# Patient Record
Sex: Male | Born: 1960
Health system: Southern US, Community
[De-identification: ages and names within clinical notes are randomized; demographics above are authoritative.]

## PROBLEM LIST (undated history)

## (undated) DIAGNOSIS — C61 Malignant neoplasm of prostate: Secondary | ICD-10-CM

## (undated) DIAGNOSIS — K219 Gastro-esophageal reflux disease without esophagitis: Secondary | ICD-10-CM

## (undated) DIAGNOSIS — I10 Essential (primary) hypertension: Secondary | ICD-10-CM

## (undated) HISTORY — PX: PROSTATE BIOPSY: SHX241

## (undated) HISTORY — PX: SHOULDER SURGERY: SHX246

## (undated) HISTORY — PX: COLONOSCOPY: SHX174

---

## 2008-09-29 DIAGNOSIS — S76119A Strain of unspecified quadriceps muscle, fascia and tendon, initial encounter: Secondary | ICD-10-CM | POA: Insufficient documentation

## 2008-09-29 DIAGNOSIS — M79659 Pain in unspecified thigh: Secondary | ICD-10-CM | POA: Insufficient documentation

## 2008-10-26 DIAGNOSIS — S76119A Strain of unspecified quadriceps muscle, fascia and tendon, initial encounter: Secondary | ICD-10-CM | POA: Insufficient documentation

## 2014-06-10 DEATH — deceased

## 2015-04-10 DIAGNOSIS — M436 Torticollis: Secondary | ICD-10-CM | POA: Diagnosis not present

## 2015-04-10 DIAGNOSIS — H6691 Otitis media, unspecified, right ear: Secondary | ICD-10-CM | POA: Diagnosis not present

## 2015-05-10 DIAGNOSIS — R3915 Urgency of urination: Secondary | ICD-10-CM | POA: Diagnosis not present

## 2015-05-10 DIAGNOSIS — R39198 Other difficulties with micturition: Secondary | ICD-10-CM | POA: Diagnosis not present

## 2015-05-10 DIAGNOSIS — R972 Elevated prostate specific antigen [PSA]: Secondary | ICD-10-CM | POA: Diagnosis not present

## 2015-08-11 DIAGNOSIS — L300 Nummular dermatitis: Secondary | ICD-10-CM | POA: Diagnosis not present

## 2015-08-11 DIAGNOSIS — L86 Keratoderma in diseases classified elsewhere: Secondary | ICD-10-CM | POA: Diagnosis not present

## 2015-08-11 DIAGNOSIS — L730 Acne keloid: Secondary | ICD-10-CM | POA: Diagnosis not present

## 2015-08-11 DIAGNOSIS — L905 Scar conditions and fibrosis of skin: Secondary | ICD-10-CM | POA: Diagnosis not present

## 2015-09-09 DIAGNOSIS — Z6833 Body mass index (BMI) 33.0-33.9, adult: Secondary | ICD-10-CM | POA: Diagnosis not present

## 2015-09-09 DIAGNOSIS — Z131 Encounter for screening for diabetes mellitus: Secondary | ICD-10-CM | POA: Diagnosis not present

## 2015-09-09 DIAGNOSIS — K210 Gastro-esophageal reflux disease with esophagitis: Secondary | ICD-10-CM | POA: Diagnosis not present

## 2015-09-09 DIAGNOSIS — L2089 Other atopic dermatitis: Secondary | ICD-10-CM | POA: Diagnosis not present

## 2015-09-09 DIAGNOSIS — Z79899 Other long term (current) drug therapy: Secondary | ICD-10-CM | POA: Diagnosis not present

## 2015-12-29 DIAGNOSIS — L730 Acne keloid: Secondary | ICD-10-CM | POA: Diagnosis not present

## 2015-12-29 DIAGNOSIS — D692 Other nonthrombocytopenic purpura: Secondary | ICD-10-CM | POA: Diagnosis not present

## 2015-12-29 DIAGNOSIS — L810 Postinflammatory hyperpigmentation: Secondary | ICD-10-CM | POA: Diagnosis not present

## 2015-12-29 DIAGNOSIS — L309 Dermatitis, unspecified: Secondary | ICD-10-CM | POA: Diagnosis not present

## 2016-05-08 DIAGNOSIS — K210 Gastro-esophageal reflux disease with esophagitis: Secondary | ICD-10-CM | POA: Diagnosis not present

## 2016-05-08 DIAGNOSIS — Z6833 Body mass index (BMI) 33.0-33.9, adult: Secondary | ICD-10-CM | POA: Diagnosis not present

## 2016-05-08 DIAGNOSIS — B029 Zoster without complications: Secondary | ICD-10-CM | POA: Diagnosis not present

## 2016-05-08 DIAGNOSIS — J3089 Other allergic rhinitis: Secondary | ICD-10-CM | POA: Diagnosis not present

## 2017-02-08 DIAGNOSIS — I10 Essential (primary) hypertension: Secondary | ICD-10-CM | POA: Diagnosis not present

## 2017-02-08 DIAGNOSIS — Z125 Encounter for screening for malignant neoplasm of prostate: Secondary | ICD-10-CM | POA: Diagnosis not present

## 2017-02-08 DIAGNOSIS — Z6833 Body mass index (BMI) 33.0-33.9, adult: Secondary | ICD-10-CM | POA: Diagnosis not present

## 2017-02-08 DIAGNOSIS — K210 Gastro-esophageal reflux disease with esophagitis: Secondary | ICD-10-CM | POA: Diagnosis not present

## 2017-02-08 DIAGNOSIS — Z6832 Body mass index (BMI) 32.0-32.9, adult: Secondary | ICD-10-CM | POA: Diagnosis not present

## 2017-02-08 DIAGNOSIS — J3089 Other allergic rhinitis: Secondary | ICD-10-CM | POA: Diagnosis not present

## 2017-02-08 DIAGNOSIS — N410 Acute prostatitis: Secondary | ICD-10-CM | POA: Diagnosis not present

## 2017-04-10 ENCOUNTER — Ambulatory Visit: Payer: BLUE CROSS/BLUE SHIELD | Admitting: Urology

## 2017-04-10 DIAGNOSIS — N401 Enlarged prostate with lower urinary tract symptoms: Secondary | ICD-10-CM | POA: Diagnosis not present

## 2017-04-10 DIAGNOSIS — R972 Elevated prostate specific antigen [PSA]: Secondary | ICD-10-CM

## 2017-04-10 DIAGNOSIS — R3915 Urgency of urination: Secondary | ICD-10-CM

## 2017-05-25 DIAGNOSIS — J069 Acute upper respiratory infection, unspecified: Secondary | ICD-10-CM | POA: Diagnosis not present

## 2017-05-25 DIAGNOSIS — H6123 Impacted cerumen, bilateral: Secondary | ICD-10-CM | POA: Diagnosis not present

## 2017-05-25 DIAGNOSIS — R0982 Postnasal drip: Secondary | ICD-10-CM | POA: Diagnosis not present

## 2017-05-31 DIAGNOSIS — M25512 Pain in left shoulder: Secondary | ICD-10-CM | POA: Diagnosis not present

## 2017-11-28 DIAGNOSIS — Z6834 Body mass index (BMI) 34.0-34.9, adult: Secondary | ICD-10-CM | POA: Diagnosis not present

## 2017-11-28 DIAGNOSIS — Z0000 Encounter for general adult medical examination without abnormal findings: Secondary | ICD-10-CM | POA: Diagnosis not present

## 2018-07-18 DIAGNOSIS — Z03818 Encounter for observation for suspected exposure to other biological agents ruled out: Secondary | ICD-10-CM | POA: Diagnosis not present

## 2018-07-18 DIAGNOSIS — R51 Headache: Secondary | ICD-10-CM | POA: Diagnosis not present

## 2018-09-04 DIAGNOSIS — R55 Syncope and collapse: Secondary | ICD-10-CM | POA: Diagnosis not present

## 2018-09-04 DIAGNOSIS — I10 Essential (primary) hypertension: Secondary | ICD-10-CM | POA: Diagnosis not present

## 2018-09-04 DIAGNOSIS — Z6833 Body mass index (BMI) 33.0-33.9, adult: Secondary | ICD-10-CM | POA: Diagnosis not present

## 2018-09-04 DIAGNOSIS — N400 Enlarged prostate without lower urinary tract symptoms: Secondary | ICD-10-CM | POA: Diagnosis not present

## 2018-09-04 DIAGNOSIS — Z125 Encounter for screening for malignant neoplasm of prostate: Secondary | ICD-10-CM | POA: Diagnosis not present

## 2018-09-04 DIAGNOSIS — K210 Gastro-esophageal reflux disease with esophagitis: Secondary | ICD-10-CM | POA: Diagnosis not present

## 2018-09-10 DIAGNOSIS — R55 Syncope and collapse: Secondary | ICD-10-CM | POA: Diagnosis not present

## 2018-09-10 HISTORY — DX: Syncope and collapse: R55

## 2018-09-23 DIAGNOSIS — M25552 Pain in left hip: Secondary | ICD-10-CM | POA: Diagnosis not present

## 2018-09-23 DIAGNOSIS — Z79899 Other long term (current) drug therapy: Secondary | ICD-10-CM | POA: Diagnosis not present

## 2018-09-23 DIAGNOSIS — R102 Pelvic and perineal pain: Secondary | ICD-10-CM | POA: Diagnosis not present

## 2018-09-23 DIAGNOSIS — R1032 Left lower quadrant pain: Secondary | ICD-10-CM | POA: Diagnosis not present

## 2018-09-23 DIAGNOSIS — M545 Low back pain: Secondary | ICD-10-CM | POA: Diagnosis not present

## 2018-09-23 DIAGNOSIS — I10 Essential (primary) hypertension: Secondary | ICD-10-CM | POA: Diagnosis not present

## 2018-10-08 ENCOUNTER — Ambulatory Visit: Payer: BLUE CROSS/BLUE SHIELD | Admitting: Urology

## 2018-11-05 ENCOUNTER — Ambulatory Visit: Payer: BC Managed Care – PPO | Admitting: Urology

## 2018-12-16 DIAGNOSIS — Z6832 Body mass index (BMI) 32.0-32.9, adult: Secondary | ICD-10-CM | POA: Diagnosis not present

## 2018-12-16 DIAGNOSIS — Z125 Encounter for screening for malignant neoplasm of prostate: Secondary | ICD-10-CM | POA: Diagnosis not present

## 2018-12-16 DIAGNOSIS — Z0000 Encounter for general adult medical examination without abnormal findings: Secondary | ICD-10-CM | POA: Diagnosis not present

## 2019-01-21 ENCOUNTER — Other Ambulatory Visit: Payer: Self-pay

## 2019-01-21 ENCOUNTER — Other Ambulatory Visit: Payer: Self-pay | Admitting: Urology

## 2019-01-21 ENCOUNTER — Ambulatory Visit (INDEPENDENT_AMBULATORY_CARE_PROVIDER_SITE_OTHER): Payer: BC Managed Care – PPO | Admitting: Urology

## 2019-01-21 ENCOUNTER — Encounter: Payer: Self-pay | Admitting: Urology

## 2019-01-21 VITALS — BP 135/90 | HR 85 | Temp 96.8°F | Ht 66.0 in | Wt 200.0 lb

## 2019-01-21 DIAGNOSIS — R972 Elevated prostate specific antigen [PSA]: Secondary | ICD-10-CM | POA: Diagnosis not present

## 2019-01-21 LAB — POCT URINALYSIS DIPSTICK
Bilirubin, UA: NEGATIVE
Blood, UA: NEGATIVE
Glucose, UA: NEGATIVE
Ketones, UA: NEGATIVE
Leukocytes, UA: NEGATIVE
Nitrite, UA: NEGATIVE
Protein, UA: NEGATIVE
Spec Grav, UA: >=1.03 — AB (ref 1.010–1.025)
Urobilinogen, UA: NEGATIVE E.U./dL — AB
pH, UA: 5 (ref 5.0–8.0)

## 2019-01-21 MED ORDER — LEVOFLOXACIN 750 MG PO TABS: 750.0000 mg | ORAL_TABLET | Freq: Every day | ORAL | 0 refills | Status: AC

## 2019-01-21 NOTE — Progress Notes (Signed)
H&P  Chief Complaint: Elevated PSA  History of Present Illness:   1.12.2021: Here today for follow-up on elevated PSA. Most recent PSA was 8.6 on 12.7.2020 -- this is quite elevated from him and he was told to return here per his PCP. He denies having had any significant urinary complaints or changes in the last year. He does note 2 or so episodes of urge incontinence -- mostly provoked by key in door -- but this is not all that bothersome or concerning for him at the moment. He denies having had issues with weak stream.   IPSS Questionnaire (AUA-7): Over the past month.   1)  How often have you had a sensation of not emptying your bladder completely after you finish urinating?  5 - Almost always  2)  How often have you had to urinate again less than two hours after you finished urinating? 3 - About half the time  3)  How often have you found you stopped and started again several times when you urinated?  1 - Less than 1 time in 5  4) How difficult have you found it to postpone urination?  4 - More than half the time  5) How often have you had a weak urinary stream?  0 - Not at all  6) How often have you had to push or strain to begin urination?  0 - Not at all  7) How many times did you most typically get up to urinate from the time you went to bed until the time you got up in the morning?  1 - 1 time  Total score:  0-7 mildly symptomatic   8-19 moderately symptomatic   20-35 severely symptomatic     (below copied from [facility] records):   Russell Cohen is a 59 year-old male patient who was referred by Stoney Bang who is here evaluation of his PSA.  He is a patient of Dr Sherrie Sport seen in office consultation today. His last PSA was performed 02/08/2017. The last PSA value was 6.2. The patient states he does not take 5 alpha reductase inhibitor medication. He has not undergone a prior prostate biopsy. Patient does not have a family history of prostate cancer. The patient complains of lower urinary  tract symptom(s) that include urgency and nocturia. He does not have a history of prostatitis.   Referred by [name] for evaluation and management of elevated PSA. No family history of prostate cancer. His father may have had lower urinary tract symptomatology but did not have surgery. His PSA was checked just over 2 months ago when was 6.2. I have no previous data for his PSAs.   He does have lower urinary tract symptoms. Biggest issue is urinary urgency, occasional UI. IPSS 8.   He also has vague sense of discomfort in his right perirectal area. He says it feels like a pressure and tingles at times.    Home Medications:  Allergies as of 01/21/2019   No Known Allergies     Medication List       Accurate as of January 21, 2019 12:13 PM. If you have any questions, ask your nurse or doctor.        alfuzosin 10 MG 24 hr tablet Commonly known as: UROXATRAL Take 10 mg by mouth daily with breakfast.   metoprolol succinate 25 MG 24 hr tablet Commonly known as: TOPROL-XL Take 25 mg by mouth daily.       Allergies: No Known Allergies  No family  history on file.  Social History:  has no history on file for tobacco, alcohol, and drug.  ROS: A complete review of systems was performed.  All systems are negative except for pertinent findings as noted.  Physical Exam:  Vital signs in last 24 hours: BP 135/90   Pulse 85   Temp (!) 96.8 F (36 C)   Ht 5\' 6"  (1.676 m)   Wt 200 lb (90.7 kg)   BMI 32.28 kg/m  Constitutional:  Alert and oriented, No acute distress Cardiovascular: Regular rate  Respiratory: Normal respiratory effort GI: Abdomen is soft, nontender, nondistended, no abdominal masses. No CVAT. No hernias Genitourinary: Normal male phallus (uncircumcised), testes are descended bilaterally and non-tender and without masses, scrotum is normal in appearance without lesions or masses, perineum is normal on inspection. Prostate feels 40 grams in size.    Lymphatic: No  lymphadenopathy Neurologic: Grossly intact, no focal deficits Psychiatric: Normal mood and affect  Laboratory Data:  No results for input(s): WBC, HGB, HCT, PLT in the last 72 hours.  No results for input(s): NA, K, CL, GLUCOSE, BUN, CALCIUM, CREATININE in the last 72 hours.  Invalid input(s): CO3   Results for orders placed or performed in visit on 01/21/19 (from the past 24 hour(s))  POCT urinalysis dipstick     Status: Abnormal   Collection Time: 01/21/19 11:59 AM  Result Value Ref Range   Color, UA yellow    Clarity, UA clear    Glucose, UA Negative Negative   Bilirubin, UA neg    Ketones, UA neg    Spec Grav, UA >=1.030 (A) 1.010 - 1.025   Blood, UA neg    pH, UA 5.0 5.0 - 8.0   Protein, UA Negative Negative   Urobilinogen, UA negative (A) 0.2 or 1.0 E.U./dL   Nitrite, UA neg    Leukocytes, UA Negative Negative   Appearance clear    Odor     No results found for this or any previous visit (from the past 240 hour(s)).   Old records from Dr Sherrie Sport, PSA, U/A reviewed as well as old [facility]  notes  Renal Function: No results for input(s): CREATININE in the last 168 hours. CrCl cannot be calculated (No successful lab value found.).  Radiologic Imaging: No results found.  Impression/Assessment:  His PSA is significantly elevated from what seemed to be his baseline (5-6). Though it is possible that this increase could be due to a benign source, I believe it would be appropriate to move forward with [place] bx to screen for possible PCa.   Plan:   1. [place] bx -- will call to schedule. Instructions given per nurse and we discussed the procedure, what to expect, and possible risks and complications.   2. Pending results, will plan appropriate follow-up.   3. No need for repeat PSA today.

## 2019-01-21 NOTE — Patient Instructions (Addendum)
  Prostate Biopsy Instructions  Stop all aspirin or blood thinners (aspirin, plavix, coumadin, warfarin, motrin, ibuprofen, advil, aleve, naproxen, naprosyn) for 7 days prior to the procedure.  If you have any questions about stopping these medications, please contact your primary care physician or cardiologist.  Having a light meal prior to the procedure is recommended.  If you are diabetic or have low blood sugar please bring a small snack or glucose tablet.  A Fleets enema is needed to be purchased over the counter at a local pharmacy and used 2 hours before you scheduled appointment.  This can be purchased over the counter at any pharmacy.  Antibiotics will be administered in the clinic at the time of the procedure unless otherwise specified.    Please bring someone with you to the procedure to drive you home.  A follow up appointment has been scheduled for you to receive the results of the biopsy.  If you have any questions or concerns, please feel free to call the office at [phone] or send a Mychart message.    Thank you, [facility]

## 2019-02-25 ENCOUNTER — Other Ambulatory Visit: Payer: Self-pay | Admitting: Urology

## 2019-02-25 ENCOUNTER — Encounter (HOSPITAL_COMMUNITY): Payer: Self-pay

## 2019-02-25 ENCOUNTER — Ambulatory Visit (HOSPITAL_COMMUNITY)
Admission: RE | Admit: 2019-02-25 | Discharge: 2019-02-25 | Disposition: A | Discharge status: Home / Self Care | Payer: BC Managed Care – PPO | Source: Ambulatory Visit | Attending: Urology | Admitting: Urology

## 2019-02-25 ENCOUNTER — Other Ambulatory Visit: Payer: Self-pay

## 2019-02-25 DIAGNOSIS — C61 Malignant neoplasm of prostate: Secondary | ICD-10-CM | POA: Diagnosis not present

## 2019-02-25 DIAGNOSIS — R972 Elevated prostate specific antigen [PSA]: Secondary | ICD-10-CM | POA: Diagnosis not present

## 2019-02-25 DIAGNOSIS — D075 Carcinoma in situ of prostate: Secondary | ICD-10-CM | POA: Diagnosis not present

## 2019-02-25 NOTE — Procedures (Signed)
This man presents for TRUS/Bx.  Reason for procedure: Elevated PSA  Prostate exam: Symmetrical, nonnodular 40 mL gland  Appropriate timeout was performed. The patient was placed in the left lateral decubitus position.  Transrectal ultrasound probe was passed without difficulty. Prostate was scanned in transverse and sagittal dimensions.  Volume was obtained--31.89 ml. Ultrasound findings: Seminal vesicles appeared normal.  Hypoechoic areas noted within the peripheral zone, especially on the right side.  Minimal residual urine volume within the bladder. Using a spinal needle, 10 mL of 2% plain lidocaine was utilized to infiltrate the lateral aspect of the seminal vesicles bilaterally. Following this, 12 cores were taken using the biopsy gun, and the following distribution: Right base lateral, right base medial, right mid lateral, right mid medial, right apex lateral, right apex medial, left base lateral, left base medial, left mid lateral, left mid medial, left apex lateral, left apex medial.  These were all sent for pathologic review separately in formalin.  At this point, the probe was removed.  Pressure was placed on the anal area for approximately 1 minute.  After establishing no significant bleeding, the procedure was then terminated.  The patient tolerated the procedure well. He was given post-boipsy instructions. We will call with pathology results and followup.

## 2019-03-13 ENCOUNTER — Telehealth: Payer: Self-pay

## 2019-03-13 NOTE — Telephone Encounter (Signed)
Called pt. No answer. Lm we scheduled f/u appt. Appt mailed along with copy of results mailed. Path report fax via epic to PCP

## 2019-03-13 NOTE — Telephone Encounter (Signed)
-----   Message from [name], MD sent at 03/13/2019 12:38 PM EST ----- Pt called w/ Bx results. Needs PCa apt, send bx results to pt and Dr Sherrie Sport ----- Message ----- From: Dorisann Frames, RN Sent: 02/26/2019   3:48 PM EST To: [name], MD  Prostate biopsy result

## 2019-03-24 DIAGNOSIS — I10 Essential (primary) hypertension: Secondary | ICD-10-CM | POA: Diagnosis not present

## 2019-03-24 DIAGNOSIS — K2100 Gastro-esophageal reflux disease with esophagitis, without bleeding: Secondary | ICD-10-CM | POA: Diagnosis not present

## 2019-03-24 DIAGNOSIS — N400 Enlarged prostate without lower urinary tract symptoms: Secondary | ICD-10-CM | POA: Diagnosis not present

## 2019-03-24 DIAGNOSIS — Z6831 Body mass index (BMI) 31.0-31.9, adult: Secondary | ICD-10-CM | POA: Diagnosis not present

## 2019-04-22 ENCOUNTER — Encounter: Payer: Self-pay | Admitting: Urology

## 2019-04-22 ENCOUNTER — Ambulatory Visit (INDEPENDENT_AMBULATORY_CARE_PROVIDER_SITE_OTHER): Payer: BC Managed Care – PPO | Admitting: Urology

## 2019-04-22 ENCOUNTER — Other Ambulatory Visit: Payer: Self-pay

## 2019-04-22 VITALS — BP 119/74 | HR 89 | Temp 97.2°F | Ht 66.0 in | Wt 200.0 lb

## 2019-04-22 DIAGNOSIS — N401 Enlarged prostate with lower urinary tract symptoms: Secondary | ICD-10-CM | POA: Insufficient documentation

## 2019-04-22 DIAGNOSIS — R972 Elevated prostate specific antigen [PSA]: Secondary | ICD-10-CM | POA: Diagnosis not present

## 2019-04-22 DIAGNOSIS — R3915 Urgency of urination: Secondary | ICD-10-CM | POA: Diagnosis not present

## 2019-04-22 DIAGNOSIS — C61 Malignant neoplasm of prostate: Secondary | ICD-10-CM | POA: Insufficient documentation

## 2019-04-22 LAB — POCT URINALYSIS DIPSTICK
Bilirubin, UA: NEGATIVE
Blood, UA: NEGATIVE
Glucose, UA: NEGATIVE
Ketones, UA: NEGATIVE
Leukocytes, UA: NEGATIVE
Nitrite, UA: NEGATIVE
Protein, UA: NEGATIVE
Spec Grav, UA: <=1.005 — AB (ref 1.010–1.025)
Urobilinogen, UA: 0.2 E.U./dL
pH, UA: 6 (ref 5.0–8.0)

## 2019-04-22 NOTE — Progress Notes (Signed)
H&P  Chief Complaint: New diagnosis of prostate cancer  History of Present Illness: 59 year old male with recently diagnosed prostate cancer presents for prostate cancer consultation.  At the time of diagnosis, PSA was 8.6.  He underwent ultrasound and biopsy of his prostate on 02/25/2019.  At that time, prostate volume was 32 mL.  4/12 cores were positive for adenocarcinoma: 2 cores revealed GS 3+4 pattern--right mid lateral (90%), left apex medial (10%) 2 cores revealed GS 3+3 pattern--right base lateral (20%), left mid lateral (5%)  IPSS 8, quality-of-life score 3.  He is on alfuzosin Shim score 18  No past medical history on file.  No past surgical history on file.  Home Medications:  Allergies as of 04/22/2019   No Known Allergies     Medication List       Accurate as of April 22, 2019  4:19 PM. If you have any questions, ask your nurse or doctor.        alfuzosin 10 MG 24 hr tablet Commonly known as: UROXATRAL Take 10 mg by mouth daily with breakfast.   cyclobenzaprine 10 MG tablet Commonly known as: FLEXERIL Take 10 mg by mouth every 8 (eight) hours as needed.   levocetirizine 5 MG tablet Commonly known as: XYZAL Take 5 mg by mouth at bedtime.   metoprolol succinate 25 MG 24 hr tablet Commonly known as: TOPROL-XL Take 25 mg by mouth daily.       Allergies: No Known Allergies  No family history on file.  Social History:  has no history on file for tobacco, alcohol, and drug.  ROS: A complete review of systems was performed.  All systems are negative except for pertinent findings as noted.  Physical Exam:  Vital signs in last 24 hours: BP 119/74   Pulse 89   Temp (!) 97.2 F (36.2 C)   Ht 5\' 6"  (1.676 m)   Wt 200 lb (90.7 kg)   BMI 32.28 kg/m  Constitutional:  Alert and oriented, No acute distress Cardiovascular: Regular rate  Respiratory: Normal respiratory effort Lymphatic: No lymphadenopathy Neurologic: Grossly intact, no focal  deficits Psychiatric: Normal mood and affect  I have reviewed prior pt notes  I have reviewed notes from referring/previous physicians  I have reviewed urinalysis results  I have independently reviewed prior imaging  I have reviewed prior PSA/bx  results   Results for orders placed or performed in visit on 04/22/19 (from the past 24 hour(s))  POCT urinalysis dipstick     Status: Abnormal   Collection Time: 04/22/19  4:03 PM  Result Value Ref Range   Color, UA yellow    Clarity, UA     Glucose, UA Negative Negative   Bilirubin, UA neg    Ketones, UA neg    Spec Grav, UA <=1.005 (A) 1.010 - 1.025   Blood, UA neg    pH, UA 6.0 5.0 - 8.0   Protein, UA Negative Negative   Urobilinogen, UA 0.2 0.2 or 1.0 E.U./dL   Nitrite, UA neg    Leukocytes, UA Negative Negative   Appearance clear    Odor     No results found for this or any previous visit (from the past 240 hour(s)).  Renal Function: No results for input(s): CREATININE in the last 168 hours. CrCl cannot be calculated (No successful lab value found.).  Radiologic Imaging: No results found.  Impression/Assessment:  New diagnosis of low/low intermediate risk prostate cancer, patient does have mild to moderate voiding symptoms despite alfuzosin.  Fair erectile function  Plan:  The patient was counseled about the natural history of prostate cancer and the standard treatment options that are available for prostate cancer. It was explained to him how his age and life expectancy, clinical stage, Gleason score, and PSA affect his prognosis, the decision to proceed with additional staging studies, as well as how that information influences recommended treatment strategies. We discussed the roles for active surveillance, radiation therapy, surgical therapy, androgen deprivation, as well as ablative therapy options for the treatment of prostate cancer as appropriate to his individual cancer situation. We discussed the risks and  benefits of these options with regard to their impact on cancer control and also in terms of potential adverse events, complications, and impact on quality of life particularly related to urinary, bowel, and sexual function. The patient was encouraged to ask questions throughout the discussion today and all questions were answered to his stated satisfaction. In addition, the patient was provided with and/or directed to appropriate resources and literature for further education about prostate cancer and treatment options.  He is more interested in brachytherapy then with surgery.  I did discuss with him the fact that symptoms may be exacerbated with the brachytherapy.  He would still like to proceed.  I will set up an appointment for him to see Dr. Tyler Pita at the regional [place].  47 minutes spent face-to-face with the patient and his brother in this consultation.

## 2019-04-22 NOTE — Progress Notes (Signed)

## 2019-05-05 ENCOUNTER — Encounter: Payer: Self-pay | Admitting: Radiation Oncology

## 2019-05-05 NOTE — Progress Notes (Signed)
GU Location of Tumor / Histology: prostatic adenocarcinoma  If Prostate Cancer, Gleason Score is (3 + 4) and PSA is (8.6) on 12/16/2018. Prostate volume: 32 mL.  Russell Cohen's psa in January 2019 was 6.2. Unfortunately his PSA December 2020 was even higher at 8.6. His PCP, Dr. Stoney Bang, referred him to [name] for further evaluation of his elevated PSA.  Biopsies of prostate (if applicable) revealed:    Past/Anticipated interventions by urology, if any: prostate biopsy, referral to Dr. Tammi Klippel to discuss brachytherapy  Past/Anticipated interventions by medical oncology, if any: no  Weight changes, if any: no  Bowel/Bladder complaints, if any: IPSS 8. SHIM 17. Patient reports he isn't sexually active at this time since he is single. Reports occasional urge incontinence. Denies dysuria or hematuria. Denies any bowel complaints.  Nausea/Vomiting, if any: no  Pain issues, if any:  Reports occasional left shoulder pain related to fall at work that resulted in shoulder surgery.  SAFETY ISSUES:  Prior radiation? denies  Pacemaker/ICD? denies  Possible current pregnancy? no, male patient  Is the patient on methotrexate? no  Current Complaints / other details:  59 year old male. Single. One child. Taking alfuzosin. Father had an enlarge prostate but was never dx with prostate ca. Patient's oldest brother just finished up tx for prostate ca. Patient has a paternal uncle with hx of prostate ca and a cousin.

## 2019-05-06 ENCOUNTER — Ambulatory Visit
Admission: RE | Admit: 2019-05-06 | Discharge: 2019-05-06 | Disposition: A | Discharge status: Home / Self Care | Payer: BC Managed Care – PPO | Source: Ambulatory Visit | Attending: Radiation Oncology | Admitting: Radiation Oncology

## 2019-05-06 ENCOUNTER — Encounter: Payer: Self-pay | Admitting: Radiation Oncology

## 2019-05-06 ENCOUNTER — Other Ambulatory Visit: Payer: Self-pay

## 2019-05-06 VITALS — Ht 66.0 in | Wt 200.0 lb

## 2019-05-06 DIAGNOSIS — C61 Malignant neoplasm of prostate: Secondary | ICD-10-CM | POA: Diagnosis not present

## 2019-05-06 DIAGNOSIS — Z8042 Family history of malignant neoplasm of prostate: Secondary | ICD-10-CM | POA: Diagnosis not present

## 2019-05-06 DIAGNOSIS — R972 Elevated prostate specific antigen [PSA]: Secondary | ICD-10-CM | POA: Diagnosis not present

## 2019-05-06 HISTORY — DX: Malignant neoplasm of prostate: C61

## 2019-05-06 NOTE — Progress Notes (Signed)
Radiation Oncology         [phone] ________________________________  Initial Outpatient Consultation - Conducted via Telephone due to current COVID-19 concerns for limiting patient exposure  Name: Russell Cohen MRN: [id number]  Date: 05/06/2019  DOB: [birth year]  [id number], [name], MD  [name], MD   REFERRING PHYSICIAN: [name], MD  DIAGNOSIS: 59 y.o. gentleman with Stage T1c adenocarcinoma of the prostate with Gleason score of 3+4, and PSA of 8.6.    ICD-10-CM   1. Malignant neoplasm of prostate ([place])  C61     HISTORY OF PRESENT ILLNESS: Russell Cohen is a 59 y.o. male with a diagnosis of prostate cancer. He was noted to have an elevated PSA of 6.2 as well as increased LUTS in 01/2017 by his primary care physician, [name].  Accordingly, he was referred for evaluation in urology by [name] in 04/2017, digital rectal examination was performed at that time revealing no nodules.  He was started on Alfluzosin to address the LUTS and a repeat PSA performed at that time had decreased slightly to 5.2 so the patient elected to continue to closely monitor the PSA with his PCP.   He has continued to take Alfluzosin daily with significant imrpvement in his LUTS but his PSA rose further to 8.6 in 12/2018, and the patient was referred back to [name] on 01/21/19.  Again, no abnormalities were noted on DRE.  The patient proceeded to transrectal ultrasound with 12 biopsies of the prostate on 02/25/2019.  The prostate volume measured 32 cc.  Out of 12 core biopsies, 4 were positive.  The maximum Gleason score was 3+4, and this was seen in the left apex and right mid lateral. Additionally, Gleason 3+3 was seen in the left mid lateral (small focus) and right base lateral.  The patient reviewed the biopsy results with his urologist and he has kindly been referred today for discussion of potential radiation treatment options.  PREVIOUS RADIATION THERAPY: No  PAST MEDICAL  HISTORY:  Past Medical History:  Diagnosis Date  . Prostate cancer ([place])       PAST SURGICAL HISTORY: Past Surgical History:  Procedure Laterality Date  . PROSTATE BIOPSY    . SHOULDER SURGERY Left     FAMILY HISTORY:  Family History  Problem Relation Age of Onset  . Prostate cancer Brother   . Prostate cancer Paternal Uncle   . Prostate cancer Cousin   . Colon cancer Neg Hx   . Breast cancer Neg Hx   . Pancreatic cancer Neg Hx     SOCIAL HISTORY: The patient is single and lives in [place], [place]. Social History   Socioeconomic History  . Marital status: Single    Spouse name: Not on file  . Number of children: 1  . Years of education: Not on file  . Highest education level: Not on file  Occupational History  . Not on file  Tobacco Use  . Smoking status: Never Smoker  . Smokeless tobacco: Never Used  Substance and Sexual Activity  . Alcohol use: Yes    Alcohol/week: 1.0 standard drinks    Types: 1 Standard drinks or equivalent per week    Comment: Reports having an occasional beer or mixed drink  . Drug use: Never  . Sexual activity: Not Currently  Other Topics Concern  . Not on file  Social History Narrative   Works 12 hour shifts.   Social Determinants of Health   Financial Resource Strain:   . Difficulty  of Paying Living Expenses:   Food Insecurity:   . Worried About Charity fundraiser in the Last Year:   . Arboriculturist in the Last Year:   Transportation Needs:   . Film/video editor (Medical):   [name] Lack of Transportation (Non-Medical):   Physical Activity:   . Days of Exercise per Week:   . Minutes of Exercise per Session:   Stress:   . Feeling of Stress :   Social Connections:   . Frequency of Communication with Friends and Family:   . Frequency of Social Gatherings with Friends and Family:   . Attends Religious Services:   . Active Member of Clubs or Organizations:   . Attends Archivist Meetings:   [name] Marital Status:     Intimate Partner Violence:   . Fear of Current or Ex-Partner:   . Emotionally Abused:   [name] Physically Abused:   . Sexually Abused:     ALLERGIES: Patient has no known allergies.  MEDICATIONS:  Current Outpatient Medications  Medication Sig Dispense Refill  . alfuzosin (UROXATRAL) 10 MG 24 hr tablet Take 10 mg by mouth daily with breakfast.    . cyclobenzaprine (FLEXERIL) 10 MG tablet Take 10 mg by mouth every 8 (eight) hours as needed.    [name] levocetirizine (XYZAL) 5 MG tablet Take 5 mg by mouth at bedtime.    . metoprolol succinate (TOPROL-XL) 25 MG 24 hr tablet Take 25 mg by mouth daily.    [name] MOBIC 7.5 MG tablet Take 7.5 mg by mouth 2 (two) times daily.     No current facility-administered medications for this encounter.    REVIEW OF SYSTEMS:  On review of systems, the patient reports that he is doing well overall. He denies any chest pain, shortness of breath, cough, fevers, chills, night sweats, unintended weight changes. He denies any bowel disturbances, and denies abdominal pain, nausea or vomiting. He denies any new musculoskeletal or joint aches or pains. His IPSS was 8, indicating moderate urinary symptoms, despite uroxatrol. He reports occasional urge incontinence. His SHIM was 17, indicating he has mild to moderate erectile dysfunction. A complete review of systems is obtained and is otherwise negative.    PHYSICAL EXAM:  Wt Readings from Last 3 Encounters:  05/06/19 200 lb (90.7 kg)  04/22/19 200 lb (90.7 kg)  01/21/19 200 lb (90.7 kg)   Temp Readings from Last 3 Encounters:  04/22/19 (!) 97.2 F (36.2 C)  02/25/19 98.3 F (36.8 C) (Oral)  01/21/19 (!) 96.8 F (36 C)   BP Readings from Last 3 Encounters:  04/22/19 119/74  02/25/19 120/87  01/21/19 135/90   Pulse Readings from Last 3 Encounters:  04/22/19 89  02/25/19 80  01/21/19 85   Pain Assessment Pain Score: 5 (resulting from a fall at work) Pain Frequency: Intermittent Pain Loc:  Shoulder(left)/10  Physical exam not performed in light of telephone consult visit format.   KPS = 100  100 - Normal; no complaints; no evidence of disease. 90   - Able to carry on normal activity; minor signs or symptoms of disease. 80   - Normal activity with effort; some signs or symptoms of disease. 9   - Cares for self; unable to carry on normal activity or to do active work. 60   - Requires occasional assistance, but is able to care for most of his personal needs. 50   - Requires considerable assistance and frequent medical care. 40   -  Disabled; requires special care and assistance. 56   - Severely disabled; hospital admission is indicated although death not imminent. 85   - Very sick; hospital admission necessary; active supportive treatment necessary. 10   - Moribund; fatal processes progressing rapidly. 0     - Dead  Karnofsky DA, Abelmann WH, [place] LS and Burchenal JH [phone]) The use of the nitrogen mustards in the palliative treatment of carcinoma: with particular reference to bronchogenic carcinoma Cancer 1 [phone]  LABORATORY DATA:  No results found for: WBC, HGB, HCT, MCV, PLT No results found for: NA, K, CL, CO2 No results found for: ALT, AST, GGT, ALKPHOS, BILITOT   RADIOGRAPHY: No results found.    IMPRESSION/PLAN: This visit was conducted via Telephone to spare the patient unnecessary potential exposure in the healthcare setting during the current COVID-19 pandemic. 1. 59 y.o. gentleman with Stage T1c adenocarcinoma of the prostate with Gleason Score of 3+4, and PSA of 8.6. We discussed the patient's workup and outlined the nature of prostate cancer in this setting. The patient's T stage, Gleason's score, and PSA put him into the favorable intermediate risk group. Accordingly, he is eligible for a variety of potential treatment options including brachytherapy, 5.5 weeks of external radiation, or prostatectomy. We discussed the available radiation techniques, and  focused on the details and logistics of delivery. We discussed and outlined the risks, benefits, short and long-term effects associated with radiotherapy and compared and contrasted these with prostatectomy. We discussed the role of SpaceOAR in reducing the rectal toxicity associated with radiotherapy.   At the end of the conversation, the patient is interested in moving forward with brachytherapy and use of SpaceOAR to reduce rectal toxicity from radiotherapy.  We will share our discussion with [name] and move forward with scheduling his CT [facility] planning appointment in the near future.  The patient will be contacted by Romie Jumper in our office who will be working closely with him to coordinate OR scheduling and pre and post procedure appointments.  We will contact the pharmaceutical rep to ensure that [place] is available at the time of procedure.  He will have a prostate MRI following his post-seed CT SIM to confirm appropriate distribution of the [place].  Given current concerns for patient exposure during the COVID-19 pandemic, this encounter was conducted via telephone. The patient was notified in advance and was offered a MyChart meeting to allow for face to face communication but unfortunately reported that he did not have the appropriate resources/technology to support such a visit and instead preferred to proceed with telephone consult. The patient has given verbal consent for this type of encounter. The time spent during this encounter was 45 minutes. The attendants for this meeting include Tyler Pita MD, Ashlyn Bruning PA-C, [place], and patient, Mar[facility] General. During the encounter, Tyler Pita MD, Ashlyn Bruning PA-C, and scribe, Wilburn Mylar were located at [place].  Patient, Tam Piana was located at home.    Nicholos Johns, PA-C    Tyler Pita, MD  [facility] Direct Dial: [phone]  Fax: [phone] [email or web address]  Skype  LinkedIn  This document serves as a record of services personally performed by Tyler Pita, MD and Freeman Caldron, PA-C. It was created on their behalf by Wilburn Mylar, a trained medical scribe. The creation of this record is based on the scribe's personal observations and the provider's statements to them. This document has been checked and approved by  the attending provider.

## 2019-05-06 NOTE — Progress Notes (Signed)
See progress note under physician encounter. 

## 2019-05-12 ENCOUNTER — Encounter: Payer: Self-pay | Admitting: Medical Oncology

## 2019-05-12 NOTE — Progress Notes (Signed)
Spoke with patient to introduce myself as the prostate nurse navigator and discuss my role. I was unable to meet him 4/27, when he consulted with Dr. Tammi Klippel. He was very pleased with Ashlyn and Dr. Tammi Klippel and the information discussed. He has chosen brachytherapy to treat his prostate cancer. We discuss the next steps and is aware Enid Derry will contact him with appointments. I gave him my contact information and asked him to call me with questions or concerns. He voiced understanding and was very appreciative of the call.

## 2019-05-16 ENCOUNTER — Telehealth: Payer: Self-pay | Admitting: *Deleted

## 2019-05-16 NOTE — Telephone Encounter (Signed)
Called patient to update, spoke with patient. 

## 2019-05-20 ENCOUNTER — Telehealth: Payer: Self-pay

## 2019-05-20 ENCOUNTER — Telehealth: Payer: Self-pay | Admitting: *Deleted

## 2019-05-20 ENCOUNTER — Other Ambulatory Visit: Payer: Self-pay | Admitting: Urology

## 2019-05-20 NOTE — Telephone Encounter (Signed)
Pt made aware of post op appt.

## 2019-05-20 NOTE — Telephone Encounter (Signed)
Called patient to inform of implant date, spoke with patient and he is aware of this date. 

## 2019-05-20 NOTE — Telephone Encounter (Signed)
-----   Message from [name], MD sent at 05/20/2019 12:36 PM EDT ----- Regarding: seeds Patient scheduled for seed placement in [facility] on July 8.  He needs a preop appointment here about a month before.

## 2019-06-04 ENCOUNTER — Telehealth: Payer: Self-pay | Admitting: *Deleted

## 2019-06-04 NOTE — Telephone Encounter (Signed)
CALLED PATIENT TO REMIND OF PRE-SEED APPTS. FOR 06-05-19, SPOKE WITH PATIENT AND HE IS AWARE OF THESE APPTS.

## 2019-06-05 ENCOUNTER — Other Ambulatory Visit: Payer: Self-pay | Admitting: Urology

## 2019-06-05 ENCOUNTER — Other Ambulatory Visit: Payer: Self-pay

## 2019-06-05 ENCOUNTER — Ambulatory Visit
Admission: RE | Admit: 2019-06-05 | Discharge: 2019-06-05 | Disposition: A | Discharge status: Home / Self Care | Payer: BC Managed Care – PPO | Source: Ambulatory Visit | Attending: Radiation Oncology | Admitting: Radiation Oncology

## 2019-06-05 ENCOUNTER — Encounter (HOSPITAL_COMMUNITY)
Admission: RE | Admit: 2019-06-05 | Discharge: 2019-06-05 | Disposition: A | Discharge status: Home / Self Care | Payer: BC Managed Care – PPO | Source: Ambulatory Visit | Attending: Urology | Admitting: Urology

## 2019-06-05 ENCOUNTER — Ambulatory Visit
Admission: RE | Admit: 2019-06-05 | Discharge: 2019-06-05 | Disposition: A | Discharge status: Home / Self Care | Payer: BC Managed Care – PPO | Source: Ambulatory Visit | Attending: Urology | Admitting: Urology

## 2019-06-05 ENCOUNTER — Ambulatory Visit (HOSPITAL_COMMUNITY)
Admission: RE | Admit: 2019-06-05 | Discharge: 2019-06-05 | Disposition: A | Discharge status: Home / Self Care | Payer: BC Managed Care – PPO | Source: Ambulatory Visit | Attending: Urology | Admitting: Urology

## 2019-06-05 ENCOUNTER — Encounter: Payer: Self-pay | Admitting: Medical Oncology

## 2019-06-05 DIAGNOSIS — Z01818 Encounter for other preprocedural examination: Secondary | ICD-10-CM | POA: Insufficient documentation

## 2019-06-05 DIAGNOSIS — C61 Malignant neoplasm of prostate: Secondary | ICD-10-CM | POA: Diagnosis not present

## 2019-06-09 NOTE — Progress Notes (Signed)
  Radiation Oncology         [phone]) [phone] ________________________________  Name: Russell Cohen MRN: [id number]  Date: 06/05/2019  DOB: 07-02-1960  SIMULATION AND TREATMENT PLANNING NOTE PUBIC ARCH STUDY  [id number], [name], MD  [name], MD  DIAGNOSIS: 59 y.o. gentleman with Stage T1c adenocarcinoma of the prostate with Gleason score of 3+4, and PSA of 8.6   Oncology History  Malignant neoplasm of prostate ([place])  02/25/2019 Cancer Staging   Staging form: Prostate, AJCC 8th Edition - Clinical stage from 02/25/2019: Stage IIB (cT1c, cN0, cM0, PSA: 8.6, Grade Group: 2) - Signed by Freeman Caldron, PA-C on 05/06/2019   04/22/2019 Initial Diagnosis   Malignant neoplasm of prostate ([place])       ICD-10-CM   1. Malignant neoplasm of prostate ([place])  C61     COMPLEX SIMULATION:  The patient presented today for evaluation for possible prostate seed implant. He was brought to the radiation planning suite and placed supine on the CT couch. A 3-dimensional image study set was obtained in upload to the planning computer. There, on each axial slice, I contoured the prostate gland. Then, using three-dimensional radiation planning tools I reconstructed the prostate in view of the structures from the transperineal needle pathway to assess for possible pubic arch interference. In doing so, I did not appreciate any pubic arch interference. Also, the patient's prostate volume was estimated based on the drawn structure. The volume was 33 cc.  Given the pubic arch appearance and prostate volume, patient remains a good candidate to proceed with prostate seed implant. Today, he freely provided informed written consent to proceed.    PLAN: The patient will undergo prostate seed implant.   ________________________________  Sheral Apley. Tammi Klippel, M.D.

## 2019-07-01 ENCOUNTER — Ambulatory Visit (INDEPENDENT_AMBULATORY_CARE_PROVIDER_SITE_OTHER): Payer: BC Managed Care – PPO | Admitting: Urology

## 2019-07-01 ENCOUNTER — Other Ambulatory Visit: Payer: Self-pay

## 2019-07-01 ENCOUNTER — Encounter: Payer: Self-pay | Admitting: Urology

## 2019-07-01 VITALS — BP 105/66 | HR 90 | Temp 97.3°F | Ht 66.0 in | Wt 200.0 lb

## 2019-07-01 DIAGNOSIS — N400 Enlarged prostate without lower urinary tract symptoms: Secondary | ICD-10-CM | POA: Diagnosis not present

## 2019-07-01 DIAGNOSIS — N401 Enlarged prostate with lower urinary tract symptoms: Secondary | ICD-10-CM

## 2019-07-01 DIAGNOSIS — H60331 Swimmer's ear, right ear: Secondary | ICD-10-CM | POA: Diagnosis not present

## 2019-07-01 DIAGNOSIS — D075 Carcinoma in situ of prostate: Secondary | ICD-10-CM | POA: Diagnosis not present

## 2019-07-01 DIAGNOSIS — M79671 Pain in right foot: Secondary | ICD-10-CM | POA: Diagnosis not present

## 2019-07-01 DIAGNOSIS — K2100 Gastro-esophageal reflux disease with esophagitis, without bleeding: Secondary | ICD-10-CM | POA: Diagnosis not present

## 2019-07-01 DIAGNOSIS — I10 Essential (primary) hypertension: Secondary | ICD-10-CM | POA: Diagnosis not present

## 2019-07-01 LAB — POCT URINALYSIS DIPSTICK
Blood, UA: NEGATIVE
Glucose, UA: NEGATIVE
Ketones, UA: NEGATIVE
Leukocytes, UA: NEGATIVE
Nitrite, UA: NEGATIVE
Protein, UA: NEGATIVE
Spec Grav, UA: 1.015 (ref 1.010–1.025)
Urobilinogen, UA: 0.2 E.U./dL
pH, UA: 6 (ref 5.0–8.0)

## 2019-07-01 NOTE — Progress Notes (Signed)
See progress note.

## 2019-07-01 NOTE — Progress Notes (Signed)
H&P  Chief Complaint: Prostate Cancer  History of Present Illness:   6.22.2021: Here today for pre-op appt ahead of upcoming brachytherapy. He denies any significant complaints with regards to his overall urological health.   Past Medical History:  Diagnosis Date  . Prostate cancer [facility])     Past Surgical History:  Procedure Laterality Date  . PROSTATE BIOPSY    . SHOULDER SURGERY Left     Home Medications:  Allergies as of 07/01/2019   No Known Allergies     Medication List       Accurate as of July 01, 2019  1:16 PM. If you have any questions, ask your nurse or doctor.        alfuzosin 10 MG 24 hr tablet Commonly known as: UROXATRAL Take 10 mg by mouth daily with breakfast.   cyclobenzaprine 10 MG tablet Commonly known as: FLEXERIL Take 10 mg by mouth every 8 (eight) hours as needed.   levocetirizine 5 MG tablet Commonly known as: XYZAL Take 5 mg by mouth at bedtime.   metoprolol succinate 25 MG 24 hr tablet Commonly known as: TOPROL-XL Take 25 mg by mouth daily.   Mobic 7.5 MG tablet Generic drug: meloxicam Take 7.5 mg by mouth 2 (two) times daily.       Allergies: No Known Allergies  Family History  Problem Relation Age of Onset  . Prostate cancer Brother   . Prostate cancer Paternal Uncle   . Prostate cancer Cousin   . Colon cancer Neg Hx   . Breast cancer Neg Hx   . Pancreatic cancer Neg Hx     Social History:  reports that he has never smoked. He has never used smokeless tobacco. He reports current alcohol use of about 1.0 standard drink of alcohol per week. He reports that he does not use drugs.  ROS: Urological Symptom Review  Patient is experiencing the following symptoms: Hard to postpone urination Get up at night to urinate   Review of System Gastrointestinal (upper)  : Indigestion/heartburn Gastrointestinal (lower) : Negative for lower GI symptoms Constitutional : Negative for symptoms Skin: Negative for skin  symptoms Eyes: Negative for eye symptoms Ear/Nose/Throat : Negative for Ear/Nose/Throat symptoms Hematologic/Lymphatic: Negative for Hematologic/Lymphatic symptoms Cardiovascular : Negatite for cardiovascular symptoms Respiratory : Negative for respiratory symptoms Endocrine: Negative for endocrine symptoms Musculoskeletal: Joint pain (Shoulder) Neurological: Negative for neurological symptoms Psychologic: Negative for psychiatric symptoms   Physical Exam:  Vital signs in last 24 hours: BP 105/66   Pulse 90   Temp (!) 97.3 F (36.3 C)   Ht 5\' 6"  (1.676 m)   Wt 200 lb (90.7 kg)   BMI 32.28 kg/m  Constitutional:  Alert and oriented, No acute distress Cardiovascular: Regular rate  Respiratory: Normal respiratory effort Neurologic: Grossly intact, no focal deficits Psychiatric: Normal mood and affect    Results for orders placed or performed in visit on 07/01/19 (from the past 24 hour(s))  POCT urinalysis dipstick     Status: None   Collection Time: 07/01/19  1:02 PM  Result Value Ref Range   Color, UA yellow    Clarity, UA     Glucose, UA Negative Negative   Bilirubin, UA ne    Ketones, UA neg    Spec Grav, UA 1.015 1.010 - 1.025   Blood, UA neg    pH, UA 6.0 5.0 - 8.0   Protein, UA Negative Negative   Urobilinogen, UA 0.2 0.2 or 1.0 E.U./dL   Nitrite, UA neg  Leukocytes, UA Negative Negative   Appearance clear    Odor     I have reviewed prior pt notes  I have reviewed notes from referring/previous physicians  I have reviewed urinalysis results  I have reviewed prior PSA results    Impression/Assessment:  He is already scheduled for brachytherapy and has been briefed thoroughly on what to expect as well as possible risks and complications. He is clear to move forward with procedure from my perspective.   Plan:  1. Keep scheduled procedure -- risks and possible complications discussed in detail.   2. We will plan appropriate f/u  post-operatively  3. Advised to avoid heavy physical activity for at least 1-2 weeks after operation.

## 2019-07-10 ENCOUNTER — Encounter (HOSPITAL_BASED_OUTPATIENT_CLINIC_OR_DEPARTMENT_OTHER): Payer: Self-pay | Admitting: Urology

## 2019-07-10 ENCOUNTER — Encounter: Payer: Self-pay | Admitting: Gastroenterology

## 2019-07-10 ENCOUNTER — Other Ambulatory Visit: Payer: Self-pay

## 2019-07-10 NOTE — Progress Notes (Signed)
Spoke w/ via phone for pre-op interview---pt Lab needs dos----    none          Lab results------lab appt 07-11-19 at 945 for cbc, cmet, pt, ptt, ekg and chest xray done 06-05-19 epic COVID test ------fully vaccinated covid test not needed Arrive at -------1100 am 07-17-19 No food after midnight clear liquids from midnight until 1000 am then npo Medications to take morning of surgery ----- Diabetic medication -----omeprazole, metoprolol Patient Special Instructions -----fleets enema am of surgery Pre-Op special Istructions -----none Patient verbalized understanding of instructions that were given at this phone interview. Patient denies shortness of breath, chest pain, fever, cough a this phone interview.

## 2019-07-10 NOTE — Progress Notes (Addendum)
Spoke with: Jeneen Rinks NPO:   No food after midnight/Clear liquids until 10:00 AM DOS Arrival time: 11:00 AM Lab needs dos----N/A            Lab results------CBC, CMP, PT, PTT, EKG, CXR COVID test ------ 07/15/2019 (FULLY VACCINATED BUT UNABLE TO LOCATE CARD FOR PROOF OF VACCINATION)               Medications to take morning of surgery: METOPROLOL, OMEPRAZOLE Pre op orders in epic: YES Diabetic medication -----N/A Patient Special Instructions -----BRING COVID VACCINATION PROOF DAY OF SURGERY Pre-Op special Istructions -----FLEET ENEMA MORNING OF SURGERY Ride home: WILL CALL BACK WITH THIS INFORMATION, IS NOT SURE AT THIS TIME WHO WILL COME WITH HIM DY OF SURGERY  Patient verbalized understanding of instructions that were given at this phone interview. Patient denies shortness of breath, chest pain, fever, cough a this phone interview.

## 2019-07-10 NOTE — Progress Notes (Signed)
NEW Covid Policy July 4383  Surgery Day:  07/17/2019  Facility:  [facility]  Type of Surgery:Radioactive Seed Implant  Fully Covid Vaccinated:   1)                                           2) 04/18/2019                                          Where? [facility]                                           Type? Pfizer (looking for card)  Not Covid Vaccinated:  Yes  Do you have symptoms? No  In the past 14 days:        Have you had any symptoms? No       Have you been tested covid positive? No       Have you been in contact with someone covid positive? No        Is pt Immuno-compromised? No

## 2019-07-11 ENCOUNTER — Telehealth: Payer: Self-pay | Admitting: *Deleted

## 2019-07-11 ENCOUNTER — Inpatient Hospital Stay (HOSPITAL_COMMUNITY): Admission: RE | Admit: 2019-07-11 | Payer: BC Managed Care – PPO | Source: Ambulatory Visit

## 2019-07-11 ENCOUNTER — Other Ambulatory Visit (HOSPITAL_COMMUNITY): Payer: BC Managed Care – PPO

## 2019-07-11 NOTE — Telephone Encounter (Signed)
CALLED PATIENT TO REMIND OF LAB FOR TODAY, SPOKE WITH PATIENT AND HE IS AWARE OF THIS LAB

## 2019-07-15 ENCOUNTER — Other Ambulatory Visit: Payer: Self-pay

## 2019-07-15 ENCOUNTER — Encounter (HOSPITAL_COMMUNITY)
Admission: RE | Admit: 2019-07-15 | Discharge: 2019-07-15 | Disposition: A | Discharge status: Home / Self Care | Payer: BC Managed Care – PPO | Source: Ambulatory Visit | Attending: Urology | Admitting: Urology

## 2019-07-15 ENCOUNTER — Other Ambulatory Visit (HOSPITAL_COMMUNITY): Payer: BC Managed Care – PPO

## 2019-07-15 DIAGNOSIS — Z01812 Encounter for preprocedural laboratory examination: Secondary | ICD-10-CM | POA: Insufficient documentation

## 2019-07-15 LAB — COMPREHENSIVE METABOLIC PANEL WITH GFR
ALT: 46 U/L — ABNORMAL HIGH (ref 0–44)
AST: 80 U/L — ABNORMAL HIGH (ref 15–41)
Albumin: 4.1 g/dL (ref 3.5–5.0)
Alkaline Phosphatase: 56 U/L (ref 38–126)
Anion gap: 10 (ref 5–15)
BUN: 14 mg/dL (ref 8–23)
CO2: 26 mmol/L (ref 22–32)
Calcium: 9.4 mg/dL (ref 8.9–10.3)
Chloride: 99 mmol/L (ref 98–111)
Creatinine, Ser: 0.89 mg/dL (ref 0.61–1.24)
GFR calc Af Amer: >60 mL/min
GFR calc non Af Amer: >60 mL/min
Glucose, Bld: 85 mg/dL (ref 70–99)
Potassium: 5.9 mmol/L — ABNORMAL HIGH (ref 3.5–5.1)
Sodium: 135 mmol/L (ref 135–145)
Total Bilirubin: 1.6 mg/dL — ABNORMAL HIGH (ref 0.3–1.2)
Total Protein: 7 g/dL (ref 6.5–8.1)

## 2019-07-15 LAB — APTT: aPTT: 25 seconds (ref 24–36)

## 2019-07-15 LAB — CBC
HCT: 44.3 % (ref 39.0–52.0)
Hemoglobin: 14.4 g/dL (ref 13.0–17.0)
MCH: 30.1 pg (ref 26.0–34.0)
MCHC: 32.5 g/dL (ref 30.0–36.0)
MCV: 92.5 fL (ref 80.0–100.0)
Platelets: 178 K/uL (ref 150–400)
RBC: 4.79 MIL/uL (ref 4.22–5.81)
RDW: 13.3 % (ref 11.5–15.5)
WBC: 4.9 K/uL (ref 4.0–10.5)
nRBC: 0 % (ref 0.0–0.2)

## 2019-07-15 LAB — PROTIME-INR
INR: 1 (ref 0.8–1.2)
Prothrombin Time: 13.2 seconds (ref 11.4–15.2)

## 2019-07-16 ENCOUNTER — Telehealth: Payer: Self-pay | Admitting: *Deleted

## 2019-07-16 NOTE — Progress Notes (Signed)
SPOKE WITH PATIENT BY PHONE AND PATIENT HAS CERTIFIED LETTER FROM [place] BUT IS DOES NOT MENTION [place] ON IT, PATIENT STATES HE WILL CALL HEALTH DEPARTMENT AND TRY AND GET MODERMA PUT ON CERTIFIED LETTER

## 2019-07-16 NOTE — Progress Notes (Signed)
Spoke with beverly harrelson rn, ok to use certified letter from [place] as proof of modera vaccination, pt to bring copy day of surgery

## 2019-07-16 NOTE — Telephone Encounter (Signed)
CALLED PATIENT TO REMIND OF PROCEDURE FOR 07-17-19, SPOKE WITH PATIENT AND HE IS AWARE OF THIS PROCEDURE

## 2019-07-17 ENCOUNTER — Ambulatory Visit (HOSPITAL_BASED_OUTPATIENT_CLINIC_OR_DEPARTMENT_OTHER): Payer: BC Managed Care – PPO | Admitting: Anesthesiology

## 2019-07-17 ENCOUNTER — Encounter (HOSPITAL_BASED_OUTPATIENT_CLINIC_OR_DEPARTMENT_OTHER): Payer: Self-pay | Admitting: Urology

## 2019-07-17 ENCOUNTER — Ambulatory Visit (HOSPITAL_BASED_OUTPATIENT_CLINIC_OR_DEPARTMENT_OTHER)
Admission: RE | Admit: 2019-07-17 | Discharge: 2019-07-17 | Disposition: A | Discharge status: Home / Self Care | Payer: BC Managed Care – PPO | Attending: Urology | Admitting: Urology

## 2019-07-17 ENCOUNTER — Encounter (HOSPITAL_BASED_OUTPATIENT_CLINIC_OR_DEPARTMENT_OTHER)
Admission: RE | Disposition: A | Discharge status: Home / Self Care | Payer: Self-pay | Source: Home / Self Care | Attending: Urology

## 2019-07-17 ENCOUNTER — Ambulatory Visit (HOSPITAL_COMMUNITY): Payer: BC Managed Care – PPO

## 2019-07-17 DIAGNOSIS — I10 Essential (primary) hypertension: Secondary | ICD-10-CM | POA: Diagnosis not present

## 2019-07-17 DIAGNOSIS — C61 Malignant neoplasm of prostate: Secondary | ICD-10-CM | POA: Insufficient documentation

## 2019-07-17 HISTORY — DX: Gastro-esophageal reflux disease without esophagitis: K21.9

## 2019-07-17 HISTORY — PX: RADIOACTIVE SEED IMPLANT: SHX5150

## 2019-07-17 HISTORY — DX: Essential (primary) hypertension: I10

## 2019-07-17 SURGERY — INSERTION, RADIATION SOURCE, PROSTATE
Anesthesia: General | Site: Prostate

## 2019-07-17 MED ORDER — ONDANSETRON HCL 4 MG/2ML IJ SOLN
INTRAMUSCULAR | Status: AC
  Filled 2019-07-17: qty 2

## 2019-07-17 MED ORDER — PROPOFOL 10 MG/ML IV BOLUS
INTRAVENOUS | Status: AC
  Filled 2019-07-17: qty 20

## 2019-07-17 MED ORDER — FENTANYL CITRATE (PF) 100 MCG/2ML IJ SOLN
INTRAMUSCULAR | Status: AC
  Filled 2019-07-17: qty 2

## 2019-07-17 MED ORDER — OXYCODONE HCL 5 MG PO TABS
ORAL_TABLET | ORAL | Status: AC
  Filled 2019-07-17: qty 2

## 2019-07-17 MED ORDER — ACETAMINOPHEN 10 MG/ML IV SOLN
INTRAVENOUS | Status: AC
  Filled 2019-07-17: qty 100

## 2019-07-17 MED ORDER — ACETAMINOPHEN 10 MG/ML IV SOLN
1000.0000 mg | Freq: Once | INTRAVENOUS | Status: AC
  Administered 2019-07-17: 1000 mg via INTRAVENOUS

## 2019-07-17 MED ORDER — KETOROLAC TROMETHAMINE 30 MG/ML IJ SOLN
30.0000 mg | Freq: Once | INTRAMUSCULAR | Status: AC
  Administered 2019-07-17: 30 mg via INTRAVENOUS

## 2019-07-17 MED ORDER — IOHEXOL 300 MG/ML  SOLN
INTRAMUSCULAR | Status: DC | PRN
  Administered 2019-07-17: 7 mL

## 2019-07-17 MED ORDER — MIDAZOLAM HCL 2 MG/2ML IJ SOLN
INTRAMUSCULAR | Status: DC | PRN
  Administered 2019-07-17: 2 mg via INTRAVENOUS

## 2019-07-17 MED ORDER — KETOROLAC TROMETHAMINE 30 MG/ML IJ SOLN
INTRAMUSCULAR | Status: AC
  Filled 2019-07-17: qty 1

## 2019-07-17 MED ORDER — LIDOCAINE HCL (CARDIAC) PF 100 MG/5ML IV SOSY
PREFILLED_SYRINGE | INTRAVENOUS | Status: DC | PRN
  Administered 2019-07-17: 100 mg via INTRAVENOUS

## 2019-07-17 MED ORDER — BELLADONNA ALKALOIDS-OPIUM 16.2-30 MG RE SUPP
RECTAL | Status: AC
  Filled 2019-07-17: qty 1

## 2019-07-17 MED ORDER — PROPOFOL 10 MG/ML IV BOLUS
INTRAVENOUS | Status: DC | PRN
  Administered 2019-07-17: 200 mg via INTRAVENOUS
  Administered 2019-07-17: 50 mg via INTRAVENOUS

## 2019-07-17 MED ORDER — HYDRALAZINE HCL 20 MG/ML IJ SOLN
INTRAMUSCULAR | Status: DC | PRN
  Administered 2019-07-17: 10 mg via INTRAVENOUS

## 2019-07-17 MED ORDER — MIDAZOLAM HCL 2 MG/2ML IJ SOLN
INTRAMUSCULAR | Status: AC
  Filled 2019-07-17: qty 2

## 2019-07-17 MED ORDER — CEFAZOLIN SODIUM-DEXTROSE 2-4 GM/100ML-% IV SOLN
INTRAVENOUS | Status: AC
  Filled 2019-07-17: qty 100

## 2019-07-17 MED ORDER — DEXAMETHASONE SODIUM PHOSPHATE 10 MG/ML IJ SOLN
INTRAMUSCULAR | Status: DC | PRN
Start: 2019-07-17 — End: 2019-07-17
  Administered 2019-07-17: 5 mg via INTRAVENOUS

## 2019-07-17 MED ORDER — LIDOCAINE 2% (20 MG/ML) 5 ML SYRINGE
INTRAMUSCULAR | Status: AC
  Filled 2019-07-17: qty 5

## 2019-07-17 MED ORDER — OXYCODONE HCL 5 MG PO TABS: 5.0000 mg | ORAL_TABLET | Freq: Three times a day (TID) | ORAL | 0 refills | Status: AC | PRN

## 2019-07-17 MED ORDER — KETOROLAC TROMETHAMINE 30 MG/ML IJ SOLN: 30.0000 mg | Freq: Once | INTRAMUSCULAR | Status: DC | PRN

## 2019-07-17 MED ORDER — LACTATED RINGERS IV SOLN: INTRAVENOUS | Status: DC

## 2019-07-17 MED ORDER — FENTANYL CITRATE (PF) 100 MCG/2ML IJ SOLN
25.0000 ug | INTRAMUSCULAR | Status: DC | PRN
  Administered 2019-07-17 (×3): 50 ug via INTRAVENOUS

## 2019-07-17 MED ORDER — CEFAZOLIN SODIUM-DEXTROSE 2-4 GM/100ML-% IV SOLN
2.0000 g | Freq: Once | INTRAVENOUS | Status: AC
  Administered 2019-07-17: 2 g via INTRAVENOUS

## 2019-07-17 MED ORDER — BELLADONNA ALKALOIDS-OPIUM 16.2-30 MG RE SUPP
1.0000 | Freq: Once | RECTAL | Status: AC
  Administered 2019-07-17: 1 via RECTAL

## 2019-07-17 MED ORDER — FENTANYL CITRATE (PF) 100 MCG/2ML IJ SOLN
INTRAMUSCULAR | Status: DC | PRN
  Administered 2019-07-17 (×3): 50 ug via INTRAVENOUS
  Administered 2019-07-17: 25 ug via INTRAVENOUS
  Administered 2019-07-17: 75 ug via INTRAVENOUS
  Administered 2019-07-17: 50 ug via INTRAVENOUS

## 2019-07-17 MED ORDER — ONDANSETRON HCL 4 MG/2ML IJ SOLN
INTRAMUSCULAR | Status: DC | PRN
  Administered 2019-07-17: 4 mg via INTRAVENOUS

## 2019-07-17 MED ORDER — FLEET ENEMA 7-19 GM/118ML RE ENEM: 1.0000 | ENEMA | Freq: Once | RECTAL | Status: DC

## 2019-07-17 MED ORDER — OXYCODONE HCL 5 MG PO TABS
10.0000 mg | ORAL_TABLET | Freq: Once | ORAL | Status: AC
  Administered 2019-07-17: 10 mg via ORAL

## 2019-07-17 SURGICAL SUPPLY — 32 items
BAG URINE DRAIN 2000ML AR STRL (UROLOGICAL SUPPLIES) ×3 IMPLANT
BLADE CLIPPER SENSICLIP SURGIC (BLADE) ×3 IMPLANT
CATH FOLEY 2WAY SLVR  5CC 16FR (CATHETERS) ×1
CATH FOLEY 2WAY SLVR 5CC 16FR (CATHETERS) ×2 IMPLANT
CATH ROBINSON RED A/P 16FR (CATHETERS) IMPLANT
CATH ROBINSON RED A/P 20FR (CATHETERS) ×3 IMPLANT
CLOTH BEACON ORANGE TIMEOUT ST (SAFETY) ×3 IMPLANT
CNTNR URN SCR LID CUP LEK RST (MISCELLANEOUS) ×4 IMPLANT
CONT SPEC 4OZ STRL OR WHT (MISCELLANEOUS) ×2
COVER BACK TABLE 60X90IN (DRAPES) ×3 IMPLANT
COVER MAYO STAND STRL (DRAPES) ×3 IMPLANT
DRAPE U-SHAPE 47X51 STRL (DRAPES) ×3 IMPLANT
DRSG TEGADERM 4X4.75 (GAUZE/BANDAGES/DRESSINGS) ×3 IMPLANT
DRSG TEGADERM 8X12 (GAUZE/BANDAGES/DRESSINGS) ×6 IMPLANT
GAUZE SPONGE 4X4 12PLY STRL (GAUZE/BANDAGES/DRESSINGS) ×3 IMPLANT
GLOVE BIO SURGEON STRL SZ7.5 (GLOVE) IMPLANT
GLOVE BIO SURGEON STRL SZ8 (GLOVE) ×6 IMPLANT
GLOVE SURG ORTHO 8.5 STRL (GLOVE) ×3 IMPLANT
GLOVE SURG SS PI 6.5 STRL IVOR (GLOVE) IMPLANT
GOWN STRL REUS W/TWL XL LVL3 (GOWN DISPOSABLE) ×3 IMPLANT
HOLDER FOLEY CATH W/STRAP (MISCELLANEOUS) ×3 IMPLANT
IV NS 1000ML (IV SOLUTION) ×1
IV NS 1000ML BAXH (IV SOLUTION) ×2 IMPLANT
KIT TURNOVER CYSTO (KITS) ×3 IMPLANT
MARKER SKIN DUAL TIP RULER LAB (MISCELLANEOUS) ×3 IMPLANT
PACK CYSTO (CUSTOM PROCEDURE TRAY) ×3 IMPLANT
SUT BONE WAX W31G (SUTURE) IMPLANT
SYR 10ML LL (SYRINGE) ×3 IMPLANT
TOWEL OR 17X26 10 PK STRL BLUE (TOWEL DISPOSABLE) ×3 IMPLANT
UNDERPAD 30X30 (UNDERPADS AND DIAPERS) ×6 IMPLANT
WATER STERILE IRR 500ML POUR (IV SOLUTION) ×3 IMPLANT
isee agx100 ×198 IMPLANT

## 2019-07-17 NOTE — OR Nursing (Signed)
Call to OR to let [name] know pt is having sever"stabbing pain at bottom.  No bleedingno swelling vital signs stable.  approx 1545 [name] assesses pt at bedside.  Orders received for belladona suppository.  16;15 Pt states discomfort easing off states pain was a 10 now its a 4. Assisted to restroom.  Pt voided 400 cc clear light yellow urine without difficulty in restroom.  Assisted to phase 2. Darin Engels rn

## 2019-07-17 NOTE — Anesthesia Procedure Notes (Signed)
Procedure Name: LMA Insertion Date/Time: 07/17/2019 1:27 PM Performed by: Raenette Rover, CRNA Pre-anesthesia Checklist: Patient identified, Emergency Drugs available, Suction available and Patient being monitored Patient Re-evaluated:Patient Re-evaluated prior to induction Oxygen Delivery Method: Circle system utilized Preoxygenation: Pre-oxygenation with 100% oxygen Induction Type: IV induction LMA: LMA inserted LMA Size: 4.0 Number of attempts: 1 Placement Confirmation: positive ETCO2 and breath sounds checked- equal and bilateral Tube secured with: Tape Dental Injury: Teeth and Oropharynx as per pre-operative assessment

## 2019-07-17 NOTE — Discharge Instructions (Signed)
°  Post Anesthesia Home Care Instructions ° °Activity: °Get plenty of rest for the remainder of the day. A responsible adult should stay with you for 24 hours following the procedure.  °For the next 24 hours, DO NOT: °-Drive a car °-Operate machinery °-Drink alcoholic beverages °-Take any medication unless instructed by your physician °-Make any legal decisions or sign important papers. ° °Meals: °Start with liquid foods such as gelatin or soup. Progress to regular foods as tolerated. Avoid greasy, spicy, heavy foods. If nausea and/or vomiting occur, drink only clear liquids until the nausea and/or vomiting subsides. Call your physician if vomiting continues. ° °Special Instructions/Symptoms: °Your throat may feel dry or sore from the anesthesia or the breathing tube placed in your throat during surgery. If this causes discomfort, gargle with warm salt water. The discomfort should disappear within 24 hours. ° °If you had a scopolamine patch placed behind your ear for the management of post- operative nausea and/or vomiting: ° °1. The medication in the patch is effective for 72 hours, after which it should be removed.  Wrap patch in a tissue and discard in the trash. Wash hands thoroughly with soap and water. °2. You may remove the patch earlier than 72 hours if you experience unpleasant side effects which may include dry mouth, dizziness or visual disturbances. °3. Avoid touching the patch. Wash your hands with soap and water after contact with the patch. °  °Radioactive Seed Implant Home Care Instructions ° ° °Activity:    Rest for the remainder of the day.  Do not drive or operate equipment today.  You may resume normal  activities in a few days as instructed by your physician, without risk of harmful radiation exposure to those around you, provided you follow the time and distance precautions on the Radiation Oncology Instruction Sheet. ° ° °Meals: Drink plenty of lipuids and eat light foods, such as gelatin or  soup this evening .  You may return to normal meal plan tomorrow. ° °Return °To Work: You may return to work as instructed by your physician. ° °Special °Instruction:   If any seeds are found, use tweezers to pick up seeds and place in a glass container of any kind and bring to your physician's office. ° °Call your physician if any of these symptoms occur: ° °· Persistent or heavy bleeding °· Urine stream diminishes or stops completely after catheter is removed °· Fever equal to or greater than 101 degrees F °· Cloudy urine with a strong foul odor °· Severe pain ° °You may feel some burning pain and/or hesitancy when you urinate after the catheter is removed.  These symptoms may increase over the next few weeks, but should diminish within forur to six weeks.  Applying moist heat to the lower abdomen or a hot tub bath may help relieve the pain.  If the discomfort becomes severe, please call your physician for additional medications. ° ° ° °

## 2019-07-17 NOTE — Transfer of Care (Signed)
Immediate Anesthesia Transfer of Care Note  Patient: Russell Cohen  Procedure(s) Performed: RADIOACTIVE SEED IMPLANT/BRACHYTHERAPY IMPLANT (N/A Prostate)  Patient Location: PACU  Anesthesia Type:General  Level of Consciousness: awake, alert , oriented, drowsy and patient cooperative  Airway & Oxygen Therapy: Patient Spontanous Breathing and Patient connected to nasal cannula oxygen  Post-op Assessment: Report given to RN and Post -op Vital signs reviewed and stable  Post vital signs: Reviewed and stable  Last Vitals:  Vitals Value Taken Time  BP 125/96 07/17/19 1441  Temp    Pulse 99 07/17/19 1444  Resp 14 07/17/19 1444  SpO2 100 % 07/17/19 1444  Vitals shown include unvalidated device data.  Last Pain:  Vitals:   07/17/19 1113  TempSrc: Oral  PainSc: 0-No pain      Patients Stated Pain Goal: 3 (83/07/46 0029)  Complications: No complications documented.

## 2019-07-17 NOTE — Interval H&P Note (Signed)
History and Physical Interval Note:  07/17/2019 11:35 AM  Russell Cohen  has presented today for surgery, with the diagnosis of PROSTATE CANCER.  The various methods of treatment have been discussed with the patient and family. After consideration of risks, benefits and other options for treatment, the patient has consented to  Procedure(s) with comments: RADIOACTIVE SEED IMPLANT/BRACHYTHERAPY IMPLANT (N/A) - 90 MINS SPACE OAR INSTILLATION (N/A) as a surgical intervention.  The patient's history has been reviewed, patient examined, no change in status, stable for surgery.  I have reviewed the patient's chart and labs.  Questions were answered to the patient's satisfaction.     Lillette Boxer Verline Kong

## 2019-07-17 NOTE — H&P (Signed)
H&P  Chief Complaint: PCa  History of Present Illness: 59 yo male presents for I 125 brachytherapy. His hx is as follows:  At the time of diagnosis, PSA was 8.6.  He underwent ultrasound and biopsy of his prostate on 02/25/2019.  At that time, prostate volume was 32 mL.  4/12 cores were positive for adenocarcinoma: 2 cores revealed GS 3+4 pattern--right mid lateral (90%), left apex medial (10%) 2 cores revealed GS 3+3 pattern--right base lateral (20%), left mid lateral (5%)  IPSS 8, quality-of-life score 3.  He is on alfuzosin Shim score 18  HE has chosen brachytherapy.   Home Medications:  Allergies as of 04/22/2019   No Known Allergies        Medication List     Past Medical History:  Diagnosis Date  . GERD (gastroesophageal reflux disease)   . Hypertension   . Prostate cancer ([place])   . Syncope and collapse 09/2018   cardiac cleared    Past Surgical History:  Procedure Laterality Date  . COLONOSCOPY    . PROSTATE BIOPSY    . SHOULDER SURGERY Left     Home Medications:  Allergies as of 07/17/2019   No Known Allergies     Medication List    Notice   Cannot display discharge medications because the patient has not yet been admitted.     Allergies: No Known Allergies  Family History  Problem Relation Age of Onset  . Prostate cancer Brother   . Prostate cancer Paternal Uncle   . Prostate cancer Cousin   . Colon cancer Neg Hx   . Breast cancer Neg Hx   . Pancreatic cancer Neg Hx     Social History:  reports that he has never smoked. He has never used smokeless tobacco. He reports current alcohol use of about 1.0 standard drink of alcohol per week. He reports that he does not use drugs.  ROS: A complete review of systems was performed.  All systems are negative except for pertinent findings as noted.  Physical Exam:  Vital signs in last 24 hours: Ht 5\' 6"  (1.676 m)   Wt 90.7 kg   BMI 32.28 kg/m  Constitutional:  Alert and oriented, No acute  distress Cardiovascular: Regular rate  Respiratory: Normal respiratory effort GI: Abdomen is soft, nontender, nondistended, no abdominal masses. No CVAT.  Genitourinary: Normal male phallus, testes are descended bilaterally and non-tender and without masses, scrotum is normal in appearance without lesions or masses, perineum is normal on inspection. Lymphatic: No lymphadenopathy Neurologic: Grossly intact, no focal deficits Psychiatric: Normal mood and affect  Laboratory Data:  Recent Labs    07/15/19 1124  WBC 4.9  HGB 14.4  HCT 44.3  PLT 178    Recent Labs    07/15/19 1124  NA 135  K 5.9*  CL 99  GLUCOSE 85  BUN 14  CALCIUM 9.4  CREATININE 0.89     No results found for this or any previous visit (from the past 24 hour(s)). No results found for this or any previous visit (from the past 240 hour(s)).  Renal Function: Recent Labs    07/15/19 1124  CREATININE 0.89   Estimated Creatinine Clearance: 92 mL/min (by C-G formula based on SCr of 0.89 mg/dL).  Radiologic Imaging: No results found.  Impression/Assessment:  PCa   Plan:  I 125 brachytherapy

## 2019-07-17 NOTE — Anesthesia Preprocedure Evaluation (Signed)
Anesthesia Evaluation  Patient identified by MRN, date of birth, ID band Patient awake    Reviewed: Allergy & Precautions, NPO status , Patient's Chart, lab work & pertinent test results  Airway Mallampati: II  TM Distance: >3 FB Neck ROM: Full    Dental no notable dental hx.    Pulmonary neg pulmonary ROS,    Pulmonary exam normal breath sounds clear to auscultation       Cardiovascular hypertension, Normal cardiovascular exam Rhythm:Regular Rate:Normal     Neuro/Psych negative neurological ROS  negative psych ROS   GI/Hepatic Neg liver ROS, GERD  ,  Endo/Other  negative endocrine ROS  Renal/GU negative Renal ROS  negative genitourinary   Musculoskeletal negative musculoskeletal ROS (+)   Abdominal   Peds negative pediatric ROS (+)  Hematology negative hematology ROS (+)   Anesthesia Other Findings   Reproductive/Obstetrics negative OB ROS                             Anesthesia Physical Anesthesia Plan  ASA: II  Anesthesia Plan: General   Post-op Pain Management:    Induction: Intravenous  PONV Risk Score and Plan: 2 and Ondansetron and Dexamethasone  Airway Management Planned: LMA  Additional Equipment:   Intra-op Plan:   Post-operative Plan: Extubation in OR  Informed Consent: I have reviewed the patients History and Physical, chart, labs and discussed the procedure including the risks, benefits and alternatives for the proposed anesthesia with the patient or authorized representative who has indicated his/her understanding and acceptance.     Dental advisory given  Plan Discussed with: CRNA and Surgeon  Anesthesia Plan Comments:         Anesthesia Quick Evaluation

## 2019-07-17 NOTE — Op Note (Signed)
Preoperative diagnosis: Clinical stage TI C adenocarcinoma the prostate   Postoperative diagnosis: Same   Procedure: I-125 prostate seed implantation, flexible cystoscopy  Surgeon: Lillette Boxer. Jhene Westmoreland M.D.  Radiation Oncologist: Tyler Pita, M.D.  Anesthesia: Gen.   Indications: Patient  was diagnosed with clinical stage TI C prostate cancer. We had extensive discussion with him about treatment options versus. He elected to proceed with seed implantation. He underwent consultation my office as well as with Dr. Tammi Klippel. He appeared to understand the advantages disadvantages potential risks of this treatment option. Full informed consent has been obtained.   Technique and findings: Patient was brought the operating room where he had successful induction of general anesthesia. He was placed in dorso-lithotomy position and prepped and draped in usual manner. Appropriate surgical timeout was performed. Radiation oncology department placed a transrectal ultrasound probe anchoring stand. Foley catheter with contrast in the balloon was inserted without difficulty. Anchoring needles were placed within the prostate. Rectal tube was placed. Real-time contouring of the urethra prostate and rectum were performed and the dosing parameters were established. Targeted dose was 145 gray.  I was then called  to the operating suite suite for placement of the needles. A second timeout was performed. All needle passage was done with real-time transrectal ultrasound guidance with the sagittal plane. A total of 22 needles were placed.  66 active seeds were implanted.  The Foley catheter was removed and flexible cystoscopy failed to show any seeds outside the prostate.  The patient was brought to recovery room in stable condition, having tolerated the procedure well.[name]

## 2019-07-17 NOTE — Anesthesia Postprocedure Evaluation (Signed)
Anesthesia Post Note  Patient: Russell Cohen  Procedure(s) Performed: RADIOACTIVE SEED IMPLANT/BRACHYTHERAPY IMPLANT (N/A Prostate)     Patient location during evaluation: PACU Anesthesia Type: General Level of consciousness: awake and alert Pain management: pain level controlled Vital Signs Assessment: post-procedure vital signs reviewed and stable Respiratory status: spontaneous breathing, nonlabored ventilation, respiratory function stable and patient connected to nasal cannula oxygen Cardiovascular status: blood pressure returned to baseline and stable Postop Assessment: no apparent nausea or vomiting Anesthetic complications: no   No complications documented.  Last Vitals:  Vitals:   07/17/19 1445 07/17/19 1500  BP: 108/75   Pulse: 99 88  Resp: 14 16  Temp:    SpO2: 100% 100%    Last Pain:  Vitals:   07/17/19 1113  TempSrc: Oral  PainSc: 0-No pain                 Angeleena Dueitt S

## 2019-07-18 ENCOUNTER — Encounter (HOSPITAL_BASED_OUTPATIENT_CLINIC_OR_DEPARTMENT_OTHER): Payer: Self-pay | Admitting: Urology

## 2019-07-18 NOTE — Progress Notes (Signed)
  Radiation Oncology         [phone] ________________________________  Name: Russell Cohen MRN: [id number]  Date: 07/18/2019  DOB: 1960/06/15       Prostate Seed Implant  IN:[facility], [name], MD  No ref. provider found  DIAGNOSIS:  Oncology History  Malignant neoplasm of prostate ([place])  02/25/2019 Cancer Staging   Staging form: Prostate, AJCC 8th Edition - Clinical stage from 02/25/2019: Stage IIB (cT1c, cN0, cM0, PSA: 8.6, Grade Group: 2) - Signed by Freeman Caldron, PA-C on 05/06/2019   04/22/2019 Initial Diagnosis   Malignant neoplasm of prostate ([place])     PROCEDURE: Insertion of radioactive I-125 seeds into the prostate gland.  RADIATION DOSE: 145 Gy, definitive therapy.  TECHNIQUE: [name] was brought to the operating room with the urologist. He was placed in the dorsolithotomy position. He was catheterized and a rectal tube was inserted. The perineum was shaved, prepped and draped. The ultrasound probe was then introduced into the rectum to see the prostate gland.  TREATMENT DEVICE: A needle grid was attached to the ultrasound probe stand and anchor needles were placed.  3D PLANNING: The prostate was imaged in 3D using a sagittal sweep of the prostate probe. These images were transferred to the planning computer. There, the prostate, urethra and rectum were defined on each axial reconstructed image. Then, the software created an optimized 3D plan and a few seed positions were adjusted. The quality of the plan was reviewed using [facility] information for the target and the following two organs at risk:  Urethra and Rectum.  Then the accepted plan was printed and handed off to the radiation therapist.  Under my supervision, the custom loading of the seeds and spacers was carried out and loaded into sealed vicryl sleeves.  These pre-loaded needles were then placed into the needle holder.[name]  PROSTATE VOLUME STUDY:  Using transrectal ultrasound the volume of the prostate was verified to  be 36 cc.  SPECIAL TREATMENT PROCEDURE/SUPERVISION AND HANDLING: The pre-loaded needles were then delivered under sagittal guidance. A total of 22 needles were used to deposit 66 seeds in the prostate gland. The individual seed activity was 0.415 mCi.  SpaceOAR:  No  COMPLEX SIMULATION: At the end of the procedure, an anterior radiograph of the pelvis was obtained to document seed positioning and count. Cystoscopy was performed to check the urethra and bladder.  MICRODOSIMETRY: At the end of the procedure, the patient was emitting 0.153 mR/hr at 1 meter. Accordingly, he was considered safe for hospital discharge.  PLAN: The patient will return to the radiation oncology clinic for post implant CT dosimetry in three weeks.   ________________________________  Sheral Apley Tammi Klippel, M.D.

## 2019-07-25 ENCOUNTER — Telehealth: Payer: Self-pay | Admitting: Urology

## 2019-07-25 NOTE — Telephone Encounter (Signed)
Note created. Left message for pt note would be at front desk.

## 2019-07-25 NOTE — Telephone Encounter (Signed)
Pt called and requests a note for work. He states he needs the dates to be from 07/16/19 through 07/28/19. Returning on the 19th.

## 2019-08-04 ENCOUNTER — Telehealth: Payer: Self-pay | Admitting: *Deleted

## 2019-08-04 NOTE — Telephone Encounter (Signed)
Returned patient's phone call, lvm for a return call 

## 2019-08-12 ENCOUNTER — Telehealth: Payer: Self-pay | Admitting: *Deleted

## 2019-08-12 NOTE — Telephone Encounter (Signed)
Called patient to remind of post seed appts. for 08-13-19, spoke with patient and he is aware of these appts.

## 2019-08-12 NOTE — Progress Notes (Signed)
Radiation Oncology         [phone] ________________________________  Name: Dariyon Urquilla MRN: [id number]  Date: 08/13/2019  DOB: [birth year]  Post-Seed Follow-Up Visit Note  CC: Neale Burly, MD  [name], MD  Diagnosis:   59 y.o. gentleman with Stage T1c adenocarcinoma of the prostate with Gleason score of 3+4, and PSA of 8.6.    ICD-10-CM   1. Malignant neoplasm of prostate ([facility])  C61     Interval Since Last Radiation:  4 weeks 07/17/2019:  Insertion of radioactive I-125 seeds into the prostate gland; 145 Gy, definitive therapy.  Narrative:  The patient returns today for routine follow-up.  He is complaining of increased urinary frequency and urinary hesitation symptoms. He filled out a questionnaire regarding urinary function today providing and overall IPSS score of 15 characterizing his symptoms as moderate with urgency, hesitancy, intermittency and weak flow of stream.  He specifically denies dysuria, gross hematuria or incontinence.  He feels that he is emptying his bladder well on voiding.  His pre-implant score was 8. He denies any abdominal pain or bowel symptoms.  ALLERGIES:  has No Known Allergies.  Meds: Current Outpatient Medications  Medication Sig Dispense Refill  . alfuzosin (UROXATRAL) 10 MG 24 hr tablet Take 10 mg by mouth daily with breakfast.    . cyclobenzaprine (FLEXERIL) 10 MG tablet Take 10 mg by mouth every 8 (eight) hours as needed.    [name] levocetirizine (XYZAL) 5 MG tablet Take 5 mg by mouth at bedtime.    . metoprolol succinate (TOPROL-XL) 25 MG 24 hr tablet Take 25 mg by mouth daily.    [name] MOBIC 15 MG tablet Take by mouth daily.     [name] omeprazole (PRILOSEC) 20 MG capsule Take 20 mg by mouth daily.    [name] oxyCODONE (ROXICODONE) 5 MG immediate release tablet Take 1 tablet (5 mg total) by mouth every 8 (eight) hours as needed. 6 tablet 0   No current facility-administered medications for this encounter.    Physical Findings: In general this is  a well appearing African-American male in no acute distress. He's alert and oriented x4 and appropriate throughout the examination. Cardiopulmonary assessment is negative for acute distress and he exhibits normal effort.   Lab Findings: Lab Results  Component Value Date   WBC 4.9 07/15/2019   HGB 14.4 07/15/2019   HCT 44.3 07/15/2019   MCV 92.5 07/15/2019   PLT 178 07/15/2019    Radiographic Findings:  Patient underwent CT imaging in our clinic for post implant dosimetry. The CT will be reviewed by Dr. Tammi Klippel to confirm there is an adequate distribution of radioactive seeds throughout the prostate gland and ensure that there are no seeds in or near the rectum. We suspect the final radiation plan and dosimetry will show appropriate coverage of the prostate gland. He understands that we will call and inform him of any unexpected findings on further review of his imaging and dosimetry.  Impression/Plan: 59 y.o. gentleman with Stage T1c adenocarcinoma of the prostate with Gleason score of 3+4, and PSA of 8.6. The patient is recovering from the effects of radiation. His urinary symptoms should gradually improve over the next 4-6 months. We talked about this today. He is encouraged by his improvement already and is otherwise pleased with his outcome. We also talked about long-term follow-up for prostate cancer following seed implant. He understands that ongoing PSA determinations and digital rectal exams will help perform surveillance to rule out disease recurrence. He has  a follow up appointment scheduled with [facility] on 10/14/2019. He understands what to expect with his PSA measures. Patient was also educated today about some of the long-term effects from radiation including a small risk for rectal bleeding and possibly erectile dysfunction. We talked about some of the general management approaches to these potential complications. However, I did encourage the patient to contact our office or return at  any point if he has questions or concerns related to his previous radiation and prostate cancer.  Today, a comprehensive survivorship care plan and treatment summary was reviewed with the patient today detailing his prostate cancer diagnosis, treatment course, potential late/long-term effects of treatment, appropriate follow-up care with recommendations for the future, and patient education resources.  A copy of this summary, along with a letter will be sent to the patient's primary care provider via fax after today's visit.  2. Cancer screening:  Due to Mr. Schimming history and his age, he should receive screening for skin cancers, colon cancer, and lung cancer.  The information and recommendations are listed on the patient's comprehensive care plan/treatment summary and were reviewed in detail with the patient.     3. Health maintenance and wellness promotion: Mr. Mecca was encouraged to consume 5-7 servings of fruits and vegetables per day. He was provided a copy of the "Nutrition Rainbow" handout, as well as the handout "Take Control of Your Health and [place]" from the [employer].  He was also encouraged to engage in moderate to vigorous exercise for 30 minutes per day most days of the week. Information was provided regarding the [facility] fitness program, which is designed for cancer survivors to help them become more physically fit after cancer treatments. We discussed that a healthy BMI is 18.5-24.9 and that maintaining a healthy weight reduces risk of cancer recurrences.  He was instructed to limit his alcohol consumption and continue to abstain from tobacco use.  Lastly, he was encouraged to use sunscreen and wear protective clothing when in the sun.     4. Support services/counseling: It is not uncommon for this period of the patient's cancer care trajectory to be one of many emotions and stressors.  Mr. Orourke was encouraged to take advantage of our many  support services programs, support groups, and/or counseling in coping with his new life as a cancer survivor after completing anti-cancer treatment.  He was offered support today through active listening and expressive supportive counseling.  He was given information regarding our available services and encouraged to contact me with any questions or for help enrolling in any of our support group/programs.       Nicholos Johns, PA-C

## 2019-08-13 ENCOUNTER — Ambulatory Visit
Admission: RE | Admit: 2019-08-13 | Discharge: 2019-08-13 | Disposition: A | Discharge status: Home / Self Care | Payer: BC Managed Care – PPO | Source: Ambulatory Visit | Attending: Radiation Oncology | Admitting: Radiation Oncology

## 2019-08-13 ENCOUNTER — Ambulatory Visit
Admission: RE | Admit: 2019-08-13 | Discharge: 2019-08-13 | Disposition: A | Discharge status: Home / Self Care | Payer: BC Managed Care – PPO | Source: Ambulatory Visit | Attending: Urology | Admitting: Urology

## 2019-08-13 ENCOUNTER — Other Ambulatory Visit: Payer: Self-pay

## 2019-08-13 ENCOUNTER — Encounter: Payer: Self-pay | Admitting: Urology

## 2019-08-13 VITALS — BP 135/95 | HR 94 | Temp 98.2°F | Resp 20 | Ht 66.0 in | Wt 208.4 lb

## 2019-08-13 DIAGNOSIS — C61 Malignant neoplasm of prostate: Secondary | ICD-10-CM | POA: Diagnosis not present

## 2019-08-13 DIAGNOSIS — Z923 Personal history of irradiation: Secondary | ICD-10-CM | POA: Insufficient documentation

## 2019-08-13 DIAGNOSIS — Z79899 Other long term (current) drug therapy: Secondary | ICD-10-CM | POA: Diagnosis not present

## 2019-08-13 NOTE — Progress Notes (Signed)
Weight and vitals stable. Denies pain. Pre seed IPSS 8. Post seed IPSS 15. Denies dysuria, hematuria, urinary leakage or incontinence. Reports intense urge to void with delayed urine output. Denies diarrhea. Scheduled to follow up with [facility] on 10/14/19.   BP (!) 135/95   Pulse 94   Temp 98.2 F (36.8 C)   Resp 20   Ht 5\' 6"  (1.676 m)   Wt 208 lb 6.4 oz (94.5 kg)   SpO2 99%   BMI 33.64 kg/m  Wt Readings from Last 3 Encounters:  08/13/19 208 lb 6.4 oz (94.5 kg)  07/15/19 204 lb (92.5 kg)  07/01/19 200 lb (90.7 kg)

## 2019-08-14 ENCOUNTER — Ambulatory Visit: Payer: BC Managed Care – PPO | Admitting: Radiation Oncology

## 2019-08-14 ENCOUNTER — Encounter: Payer: Self-pay | Admitting: Medical Oncology

## 2019-08-24 NOTE — Progress Notes (Signed)
  Radiation Oncology         [phone] ________________________________  Name: Russell Cohen MRN: [id number]  Date: 08/13/2019  DOB: 03/22/1960  COMPLEX SIMULATION NOTE  NARRATIVE:  The patient was brought to the [facility] today following prostate seed implantation approximately one month ago.  Identity was confirmed.  All relevant records and images related to the planned course of therapy were reviewed.  Then, the patient was set-up supine.  CT images were obtained.  The CT images were loaded into the planning software.  Then the prostate and rectum were contoured.  Treatment planning then occurred.  The implanted iodine 125 seeds were identified by the physics staff for projection of radiation distribution  I have requested : 3D Simulation  I have requested a DVH of the following structures: Prostate and rectum.    ________________________________  Sheral Apley Tammi Klippel, M.D.

## 2019-09-05 ENCOUNTER — Ambulatory Visit: Payer: BC Managed Care – PPO | Admitting: Gastroenterology

## 2019-09-19 ENCOUNTER — Ambulatory Visit
Admission: RE | Admit: 2019-09-19 | Discharge: 2019-09-19 | Disposition: A | Discharge status: Home / Self Care | Payer: BC Managed Care – PPO | Source: Ambulatory Visit | Attending: Radiation Oncology | Admitting: Radiation Oncology

## 2019-09-19 ENCOUNTER — Encounter: Payer: Self-pay | Admitting: Radiation Oncology

## 2019-09-19 DIAGNOSIS — C61 Malignant neoplasm of prostate: Secondary | ICD-10-CM | POA: Insufficient documentation

## 2019-10-02 DIAGNOSIS — I10 Essential (primary) hypertension: Secondary | ICD-10-CM | POA: Diagnosis not present

## 2019-10-02 DIAGNOSIS — K219 Gastro-esophageal reflux disease without esophagitis: Secondary | ICD-10-CM | POA: Diagnosis not present

## 2019-10-02 DIAGNOSIS — N400 Enlarged prostate without lower urinary tract symptoms: Secondary | ICD-10-CM | POA: Diagnosis not present

## 2019-10-02 DIAGNOSIS — M79671 Pain in right foot: Secondary | ICD-10-CM | POA: Diagnosis not present

## 2019-10-08 DIAGNOSIS — Z23 Encounter for immunization: Secondary | ICD-10-CM | POA: Diagnosis not present

## 2019-10-14 ENCOUNTER — Ambulatory Visit: Payer: BC Managed Care – PPO | Admitting: Urology

## 2019-10-21 NOTE — Progress Notes (Signed)
  Radiation Oncology         [phone] ________________________________  Name: Leona Pressly MRN: [id number]  Date: 09/19/2019  DOB: 07-21-1960  3D Planning Note   Prostate Brachytherapy Post-Implant Dosimetry  Diagnosis: 58 y.o. gentleman with Stage T1c adenocarcinoma of the prostate with Gleason score of 3+4, and PSA of 8.6  Narrative: On a previous date, Garion Wempe returned following prostate seed implantation for post implant planning. He underwent CT scan complex simulation to delineate the three-dimensional structures of the pelvis and demonstrate the radiation distribution.  Since that time, the seed localization, and complex isodose planning with dose volume histograms have now been completed.  Results:   Prostate Coverage - The dose of radiation delivered to the 90% or more of the prostate gland (D90) was 92.22% of the prescription dose. This exceeds our goal of greater than 90%. Rectal Sparing - The volume of rectal tissue receiving the prescription dose or higher was 0.12 cc. This falls under our thresholds tolerance of 1.0 cc.  Impression: The prostate seed implant appears to show adequate target coverage and appropriate rectal sparing.  Plan:  The patient will continue to follow with urology for ongoing PSA determinations. I would anticipate a high likelihood for local tumor control with minimal risk for rectal morbidity.  ________________________________  Sheral Apley Tammi Klippel, M.D.

## 2020-01-01 DIAGNOSIS — Z8546 Personal history of malignant neoplasm of prostate: Secondary | ICD-10-CM | POA: Diagnosis not present

## 2020-01-01 DIAGNOSIS — K219 Gastro-esophageal reflux disease without esophagitis: Secondary | ICD-10-CM | POA: Diagnosis not present

## 2020-01-01 DIAGNOSIS — I10 Essential (primary) hypertension: Secondary | ICD-10-CM | POA: Diagnosis not present

## 2020-01-01 DIAGNOSIS — N400 Enlarged prostate without lower urinary tract symptoms: Secondary | ICD-10-CM | POA: Diagnosis not present

## 2020-01-01 DIAGNOSIS — M79671 Pain in right foot: Secondary | ICD-10-CM | POA: Diagnosis not present

## 2020-04-07 DIAGNOSIS — Z6833 Body mass index (BMI) 33.0-33.9, adult: Secondary | ICD-10-CM | POA: Diagnosis not present

## 2020-04-07 DIAGNOSIS — Z0000 Encounter for general adult medical examination without abnormal findings: Secondary | ICD-10-CM | POA: Diagnosis not present

## 2020-07-27 DIAGNOSIS — Z6832 Body mass index (BMI) 32.0-32.9, adult: Secondary | ICD-10-CM | POA: Diagnosis not present

## 2020-07-27 DIAGNOSIS — Z0000 Encounter for general adult medical examination without abnormal findings: Secondary | ICD-10-CM | POA: Diagnosis not present

## 2020-07-27 DIAGNOSIS — Z125 Encounter for screening for malignant neoplasm of prostate: Secondary | ICD-10-CM | POA: Diagnosis not present

## 2020-07-27 DIAGNOSIS — I10 Essential (primary) hypertension: Secondary | ICD-10-CM | POA: Diagnosis not present

## 2020-07-27 DIAGNOSIS — E782 Mixed hyperlipidemia: Secondary | ICD-10-CM | POA: Diagnosis not present

## 2020-07-27 DIAGNOSIS — K219 Gastro-esophageal reflux disease without esophagitis: Secondary | ICD-10-CM | POA: Diagnosis not present

## 2020-08-30 DIAGNOSIS — M25571 Pain in right ankle and joints of right foot: Secondary | ICD-10-CM | POA: Diagnosis not present

## 2020-08-30 DIAGNOSIS — M779 Enthesopathy, unspecified: Secondary | ICD-10-CM | POA: Diagnosis not present

## 2020-09-06 DIAGNOSIS — M25571 Pain in right ankle and joints of right foot: Secondary | ICD-10-CM | POA: Diagnosis not present

## 2020-09-06 DIAGNOSIS — M779 Enthesopathy, unspecified: Secondary | ICD-10-CM | POA: Diagnosis not present

## 2021-03-26 IMAGING — US US GUIDANCE NEEDLE PLACEMENT
1 series · 14 of 19 positions shown · non-contrast
Comparison: None.

CLINICAL DATA: Prostate biopsy.

EXAM:
IR ULTRASOUND GUIDED ASPIRATION/DRAINAGE
TECHNIQUE: Ultrasound guidance provided for prostate biopsy.

[Series 1: us guidance needle placement · 14 of 19 slices shown]
[im 1/19]
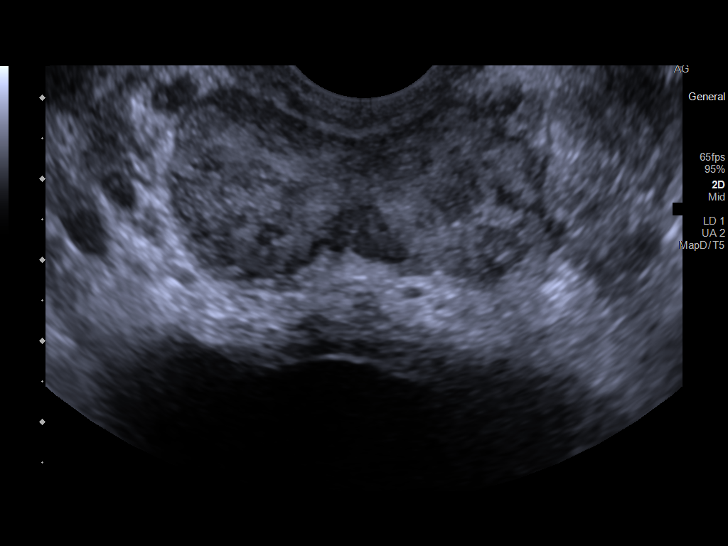
[im 3/19]
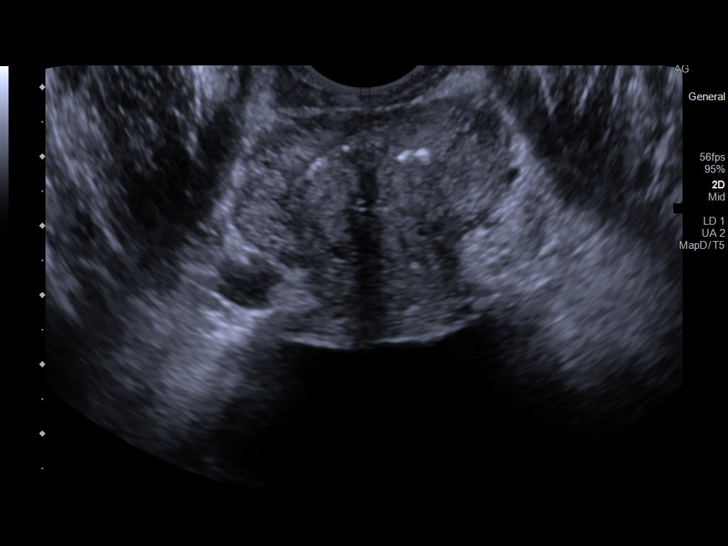
[im 4/19]
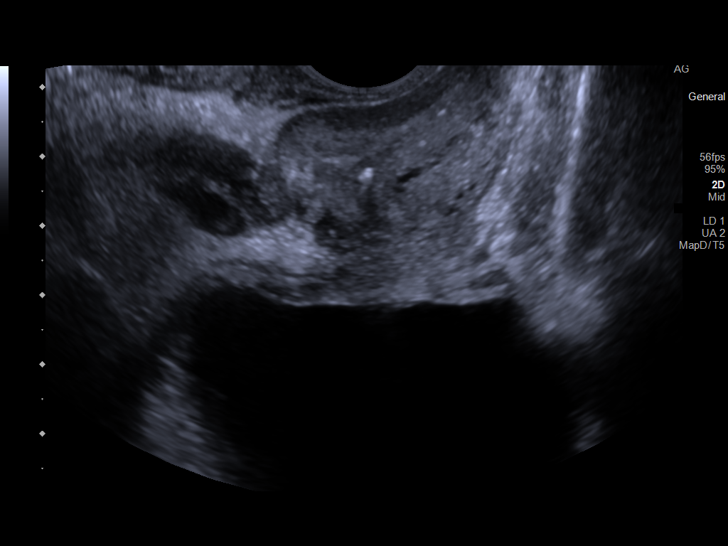
[im 5/19]
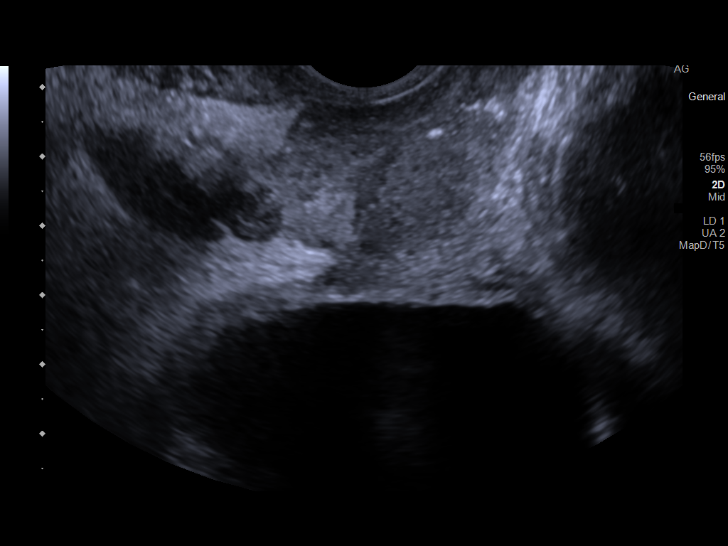
[im 7/19]
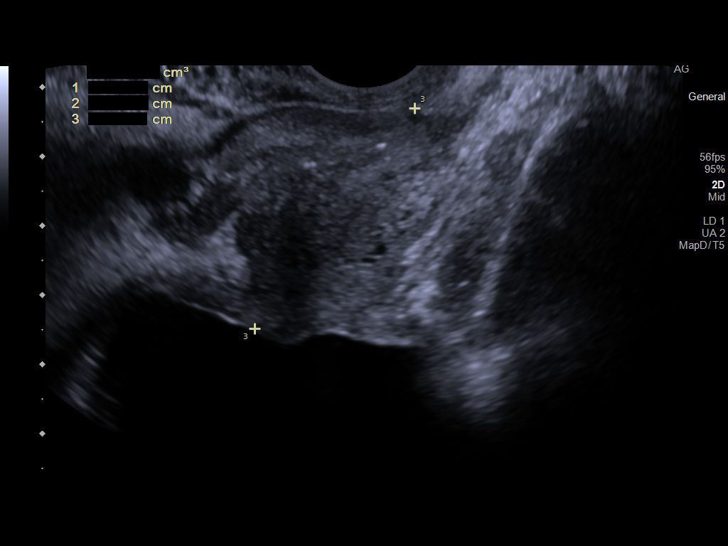
[im 8/19]
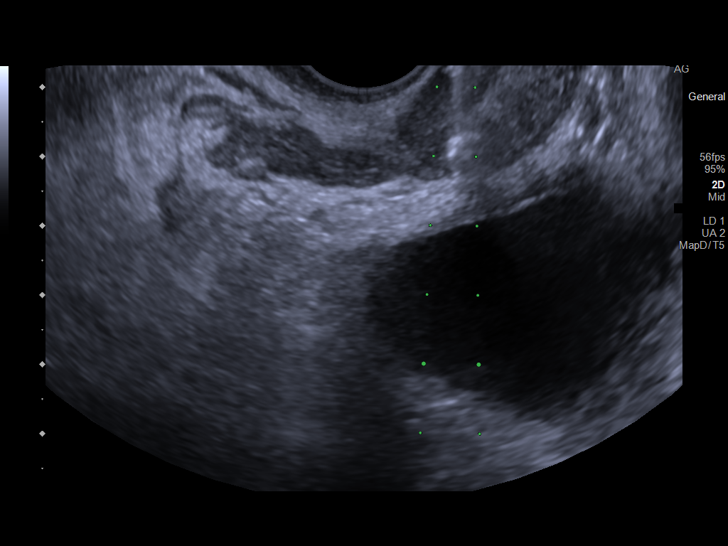
[im 9/19]
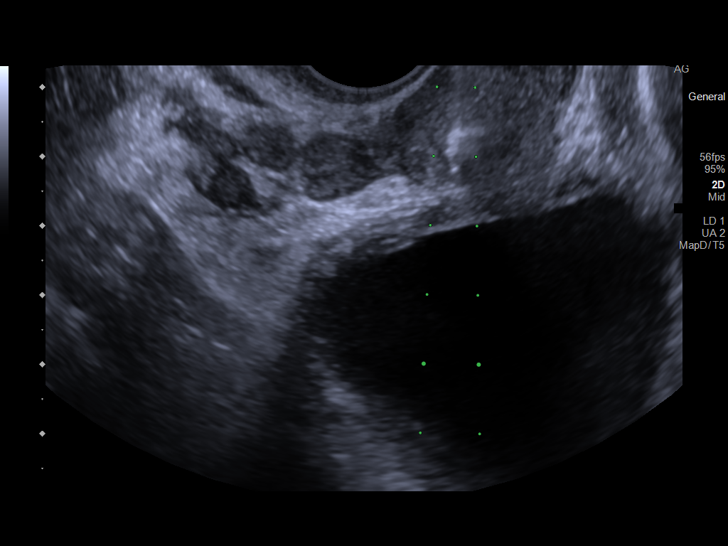
[im 11/19]
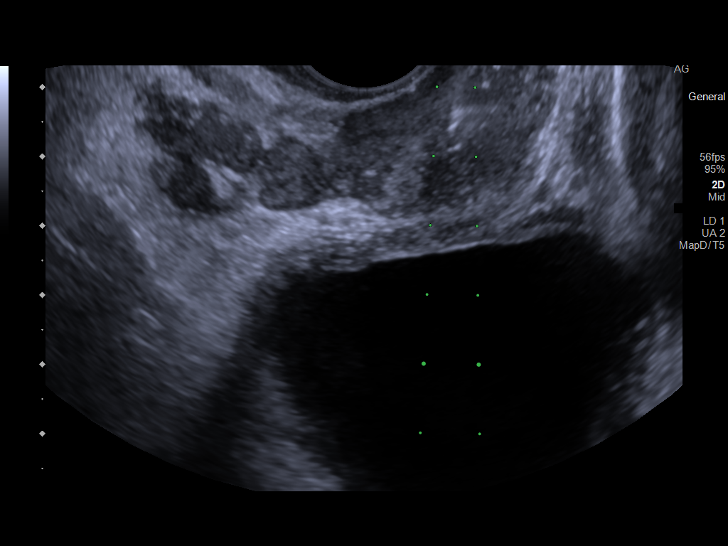
[im 12/19]
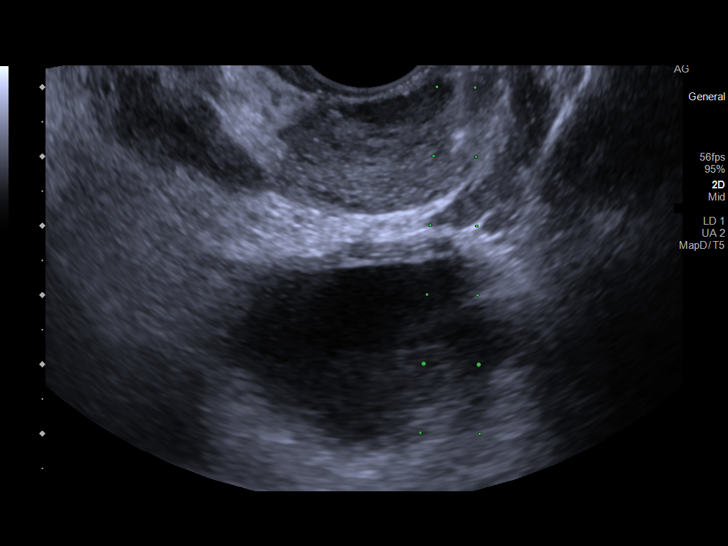
[im 13/19]
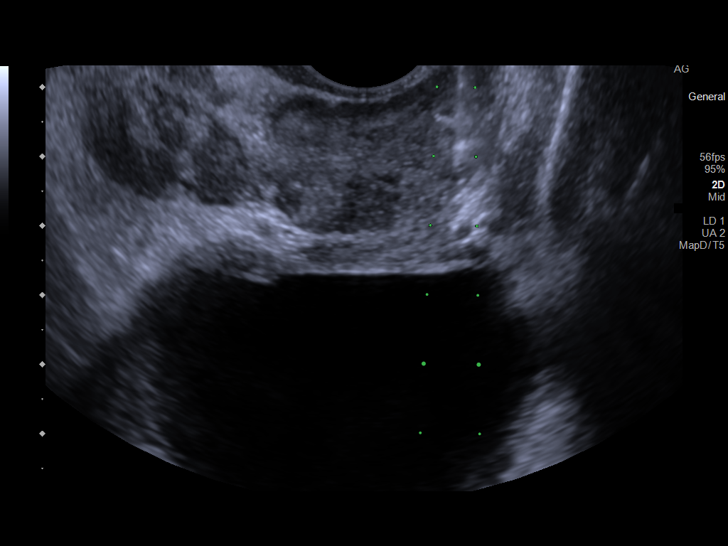
[im 15/19]
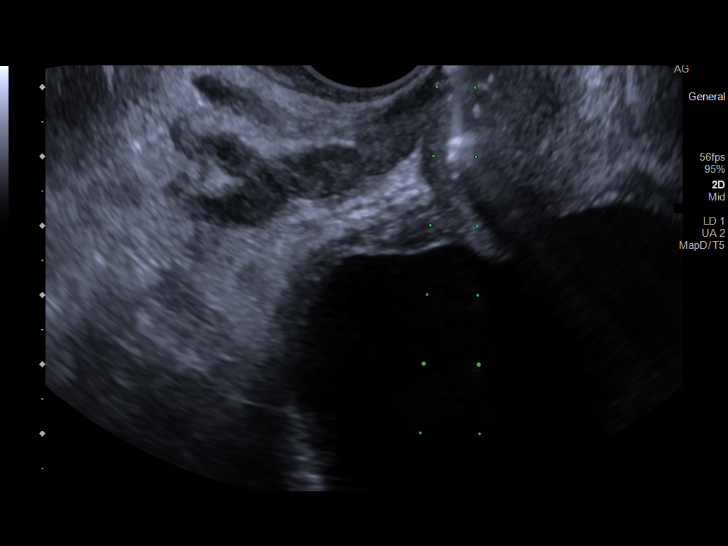
[im 16/19]
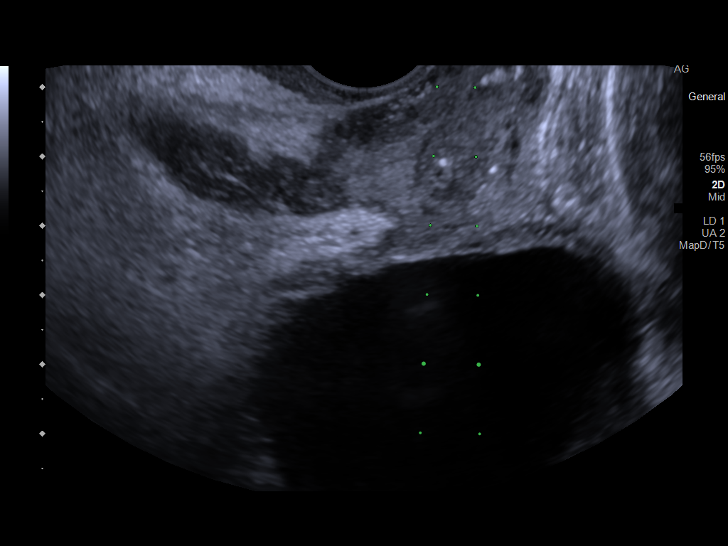
[im 17/19]
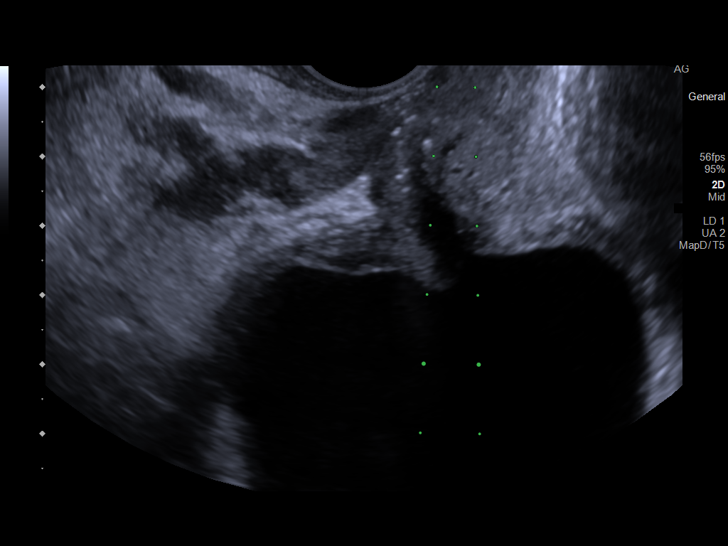
[im 19/19]
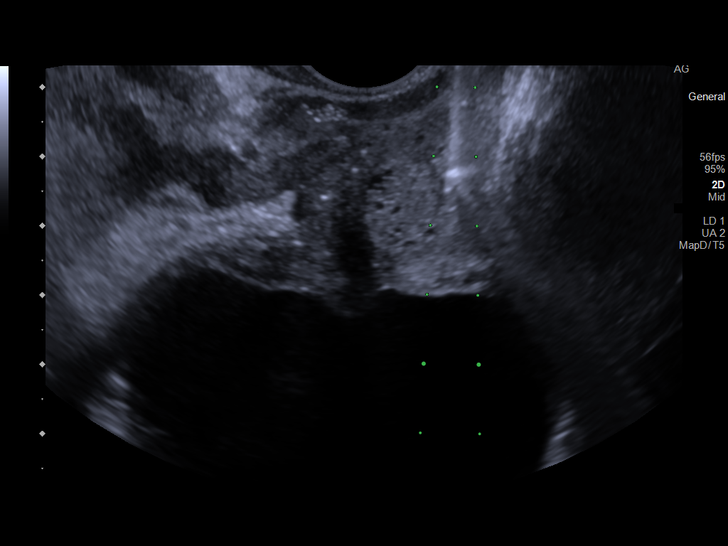

[14 of 19 positions shown; findings below may reference images not displayed]

FINDINGS: Imaging is not interpreted.
IMPRESSION: Ultrasound guidance provided for prostate biopsy.

## 2021-07-04 IMAGING — CR DG CHEST 2V
2 series · 2 of 2 positions shown · non-contrast
Comparison: Chest x-ray from 3229

CLINICAL DATA: Preoperative radiograph.

EXAM:
CHEST - 2 VIEW

[w chest pa]
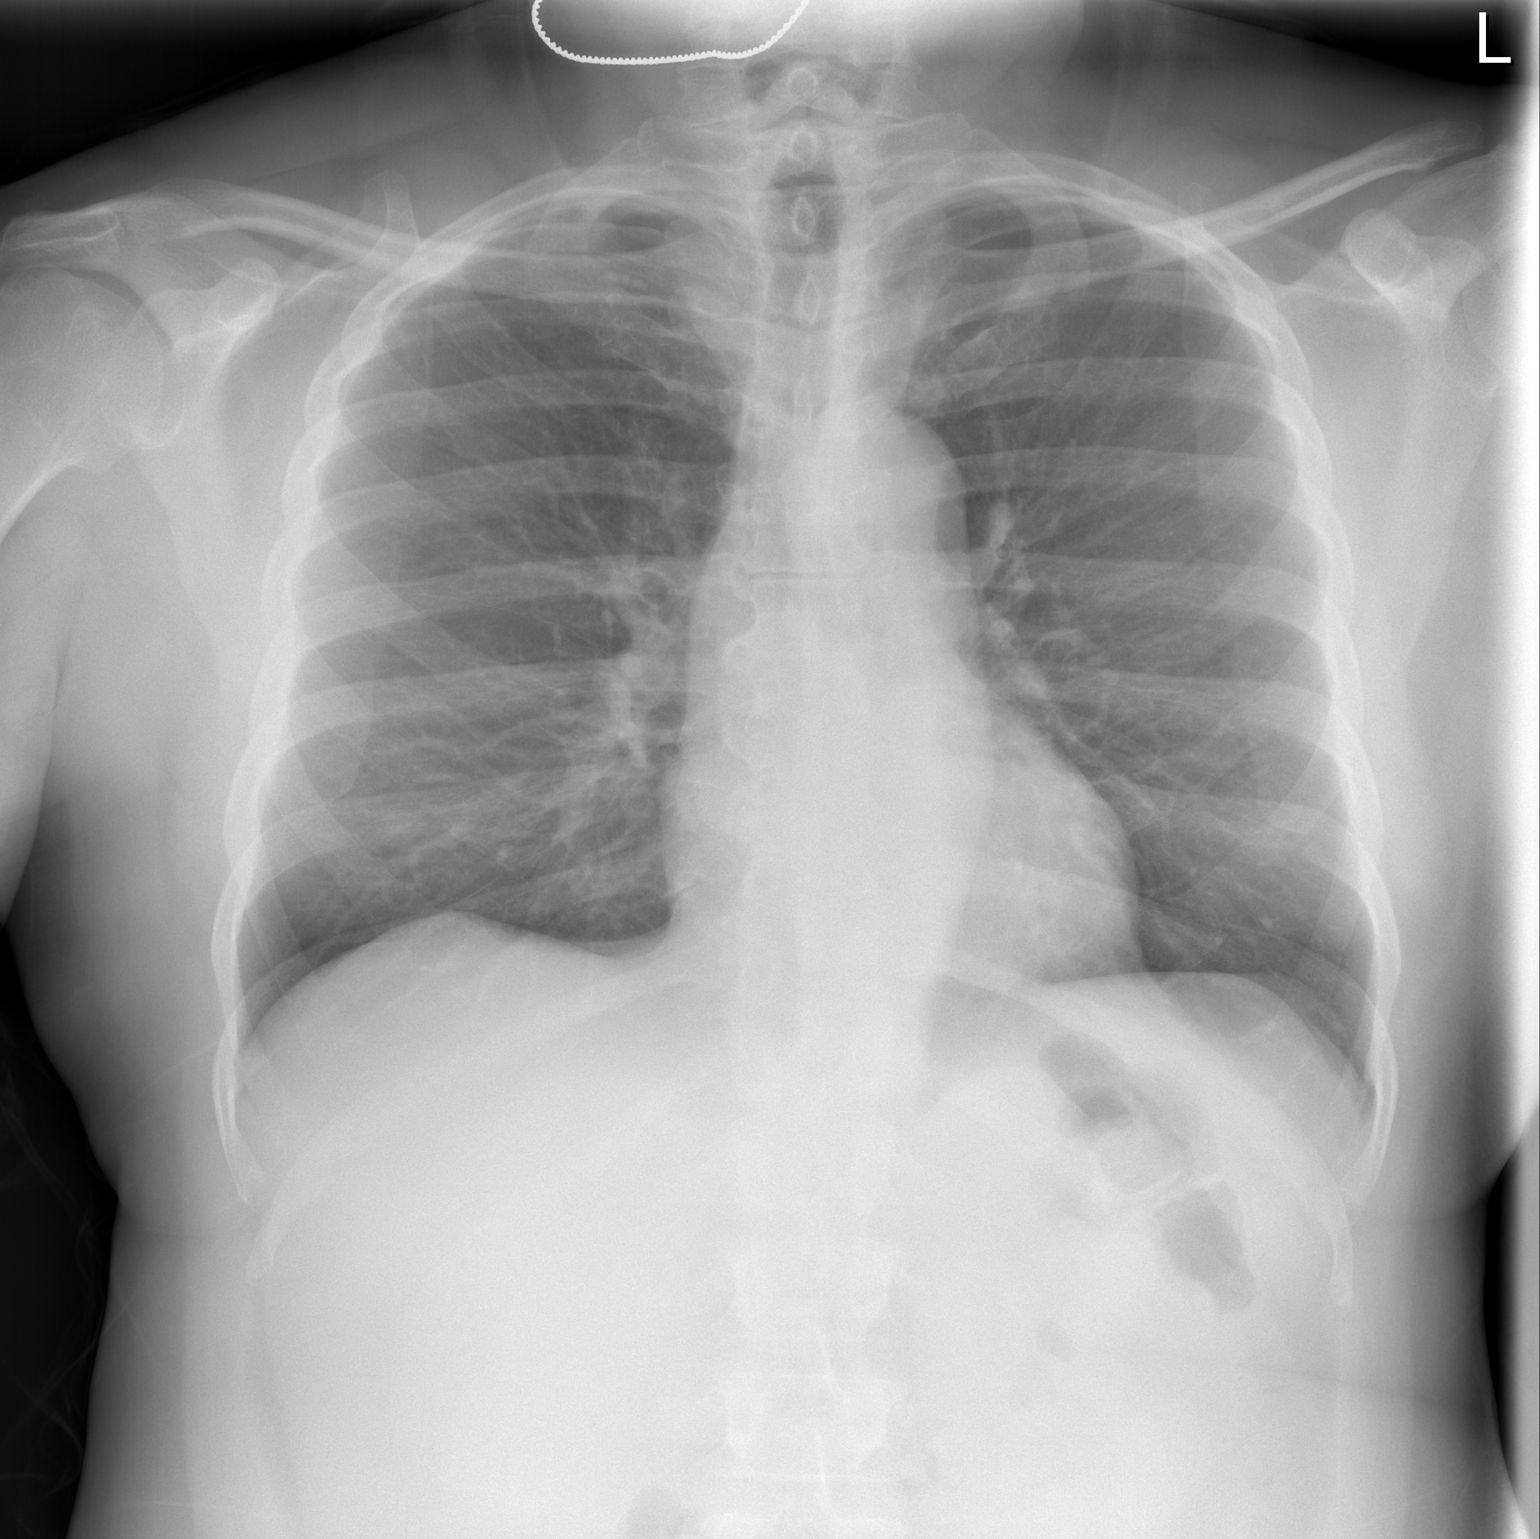

[w chest lat]
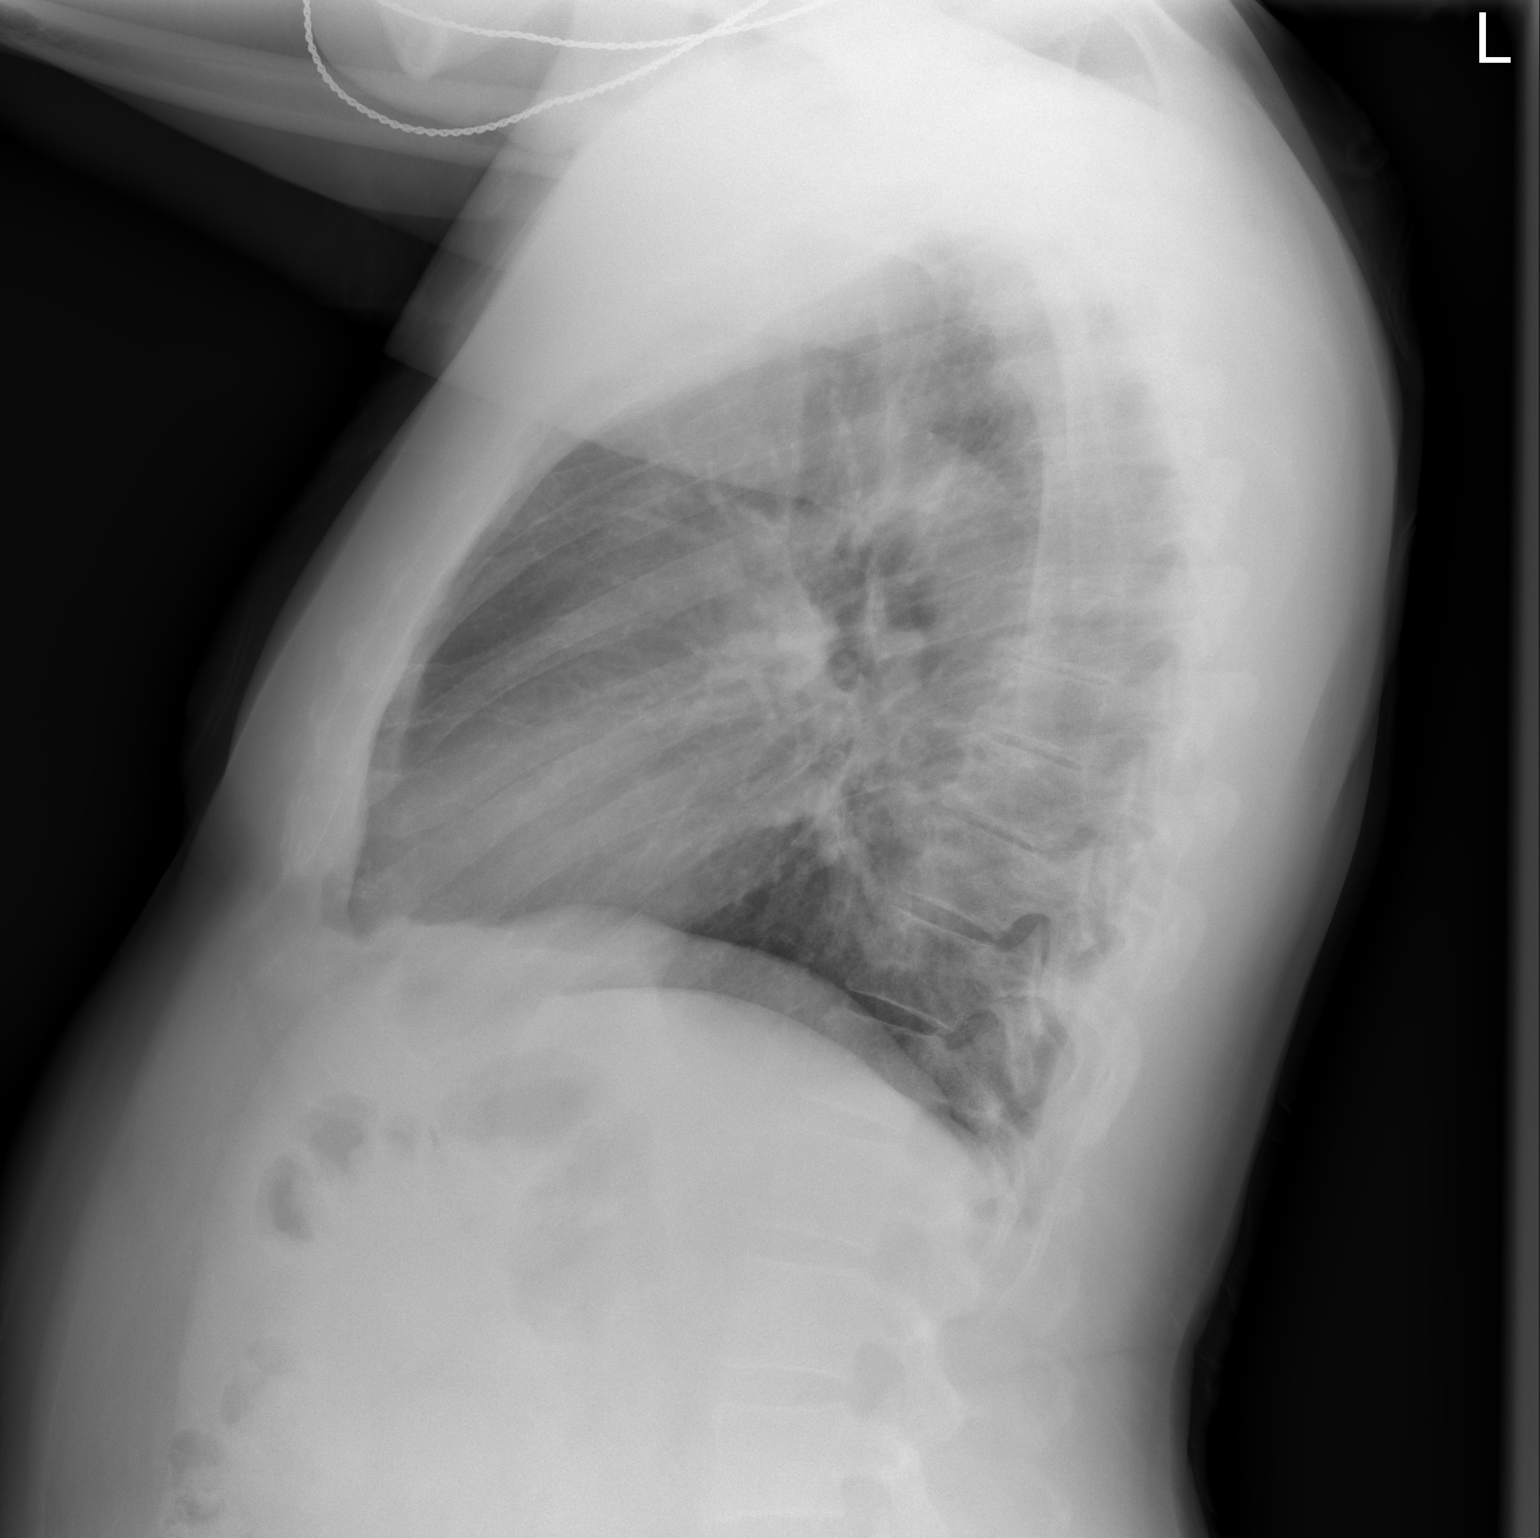

[2 of 2 positions shown; findings below may reference images not displayed]

FINDINGS: Cardiomediastinal contours and hilar structures are normal.

Lungs are clear.  No sign of pleural effusion.

Skeletal structures on limited assessment are unremarkable.
IMPRESSION: No active cardiopulmonary disease.
